# Patient Record
Sex: Male | Born: 1965 | Race: White | Hispanic: No | Marital: Married | Smoking: Never smoker
Health system: Southern US, Community
[De-identification: ages and names within clinical notes are randomized; demographics above are authoritative.]

## PROBLEM LIST (undated history)

## (undated) DIAGNOSIS — J309 Allergic rhinitis, unspecified: Secondary | ICD-10-CM

## (undated) DIAGNOSIS — K589 Irritable bowel syndrome without diarrhea: Secondary | ICD-10-CM

## (undated) DIAGNOSIS — K759 Inflammatory liver disease, unspecified: Secondary | ICD-10-CM

## (undated) HISTORY — DX: Irritable bowel syndrome without diarrhea: K58.9

## (undated) HISTORY — DX: Inflammatory liver disease, unspecified: K75.9

## (undated) HISTORY — DX: Allergic rhinitis, unspecified: J30.9

## (undated) HISTORY — PX: GANGLION CYST EXCISION: SHX1691

---

## 2014-12-08 ENCOUNTER — Inpatient Hospital Stay: Payer: 59

## 2014-12-08 ENCOUNTER — Encounter: Payer: Self-pay | Admitting: Hematology and Oncology

## 2014-12-08 ENCOUNTER — Inpatient Hospital Stay: Payer: 59 | Attending: Hematology and Oncology | Admitting: Hematology and Oncology

## 2014-12-08 ENCOUNTER — Other Ambulatory Visit: Payer: Self-pay | Admitting: *Deleted

## 2014-12-08 VITALS — BP 132/81 | HR 62 | Temp 96.4°F | Resp 18 | Ht 71.5 in | Wt 184.0 lb

## 2014-12-08 DIAGNOSIS — Z8619 Personal history of other infectious and parasitic diseases: Secondary | ICD-10-CM

## 2014-12-08 DIAGNOSIS — K589 Irritable bowel syndrome without diarrhea: Secondary | ICD-10-CM

## 2014-12-08 DIAGNOSIS — Z79899 Other long term (current) drug therapy: Secondary | ICD-10-CM | POA: Diagnosis not present

## 2014-12-08 DIAGNOSIS — D696 Thrombocytopenia, unspecified: Secondary | ICD-10-CM | POA: Diagnosis not present

## 2014-12-08 LAB — COMPREHENSIVE METABOLIC PANEL
ALT: 21 U/L (ref 17–63)
AST: 23 U/L (ref 15–41)
Albumin: 4.6 g/dL (ref 3.5–5.0)
Alkaline Phosphatase: 47 U/L (ref 38–126)
Anion gap: 11 (ref 5–15)
BUN: 13 mg/dL (ref 6–20)
CO2: 28 mmol/L (ref 22–32)
Calcium: 9.5 mg/dL (ref 8.9–10.3)
Chloride: 100 mmol/L — ABNORMAL LOW (ref 101–111)
Creatinine, Ser: 0.75 mg/dL (ref 0.61–1.24)
GFR calc Af Amer: >60 mL/min (ref >60)
GFR calc non Af Amer: >60 mL/min (ref >60)
Glucose, Bld: 108 mg/dL — ABNORMAL HIGH (ref 65–99)
Potassium: 3.9 mmol/L (ref 3.5–5.1)
Sodium: 139 mmol/L (ref 135–145)
Total Bilirubin: 0.9 mg/dL (ref 0.3–1.2)
Total Protein: 7.2 g/dL (ref 6.5–8.1)

## 2014-12-08 LAB — CBC WITH DIFFERENTIAL/PLATELET
Basophils Absolute: 0 10*3/uL (ref 0–0.1)
Basophils Relative: 1 %
Eosinophils Absolute: 0.1 10*3/uL (ref 0–0.7)
Eosinophils Relative: 2 %
HCT: 43.1 % (ref 40.0–52.0)
Hemoglobin: 14.7 g/dL (ref 13.0–18.0)
Lymphocytes Relative: 29 %
Lymphs Abs: 1.6 10*3/uL (ref 1.0–3.6)
MCH: 31.1 pg (ref 26.0–34.0)
MCHC: 34.2 g/dL (ref 32.0–36.0)
MCV: 91 fL (ref 80.0–100.0)
Monocytes Absolute: 0.4 10*3/uL (ref 0.2–1.0)
Monocytes Relative: 7 %
Neutro Abs: 3.3 10*3/uL (ref 1.4–6.5)
Neutrophils Relative %: 61 %
Platelets: 122 10*3/uL — ABNORMAL LOW (ref 150–440)
RBC: 4.74 MIL/uL (ref 4.40–5.90)
RDW: 12.3 % (ref 11.5–14.5)
WBC: 5.5 10*3/uL (ref 3.8–10.6)

## 2014-12-08 LAB — APTT: aPTT: 30 seconds (ref 24–36)

## 2014-12-08 LAB — PROTIME-INR
INR: 0.95
Prothrombin Time: 12.7 seconds (ref 11.4–15.0)

## 2014-12-08 LAB — FOLATE: Folate: 16.8 ng/mL (ref >5.9)

## 2014-12-08 NOTE — Progress Notes (Signed)
No changes since seeing Dr. Kandice Robinsons PCP in [place].

## 2014-12-08 NOTE — Progress Notes (Signed)
[facility] day:  12/08/2014  Chief Complaint: Edson Deridder is a 49 y.o. male  with thrombocytopenia who is referred in consultation by Dr. Kandice Robinsons.  HPI:  Patient states that he has had a platelet issue since 12/2013 when labs were drawn for a regular checkup and physical.  Labs on 12/18/2013 included a hematocrit of 44.2, hemoglobin 15.8, MCV 88.4, platelets 134,000, white count 6400 with an [place] of 3740.  Follow-up labs on 10/21/2014 revealed a platelet count of  121,000.  He denies any history of bruising or bleeding.  He denies any new medications except Bentyl which was prescribed after his thrombocytopenia was noted.  He states that he recently began taking herbal peppermint oil, ginger and fennel for irritable bowel.  He states that his diet is "everything in moderation".  Symptomatically, he feels great. He denies any issues. He has a history of hepatitis while in [place] in the 1970's.  He denies any prior transfusions. A few years ago, he was unable to donate blood secondary to hepatitis.   He denies any family history of any blood disorders or connective tissue disease.  Past Medical History  Diagnosis Date  . IBS (irritable bowel syndrome)   . Allergic rhinitis   . Hepatitis     Past Surgical History  Procedure Laterality Date  . Ganglion cyst excision      Family History  Problem Relation Age of Onset  . Cancer Maternal Grandmother     Pancreatic  . Cancer Maternal Grandfather     Skin Cancer    Social History:  reports that he has never smoked. He has never used smokeless tobacco. He reports that he drinks about 7.8 oz of alcohol per week. He reports that he uses illicit drugs (Marijuana).  The patient is alone today.  Allergies: Not on File  Current Medications: Current Outpatient Prescriptions  Medication Sig Dispense Refill  . dicyclomine (BENTYL) 10 MG capsule Take by mouth.    . naproxen sodium (RA NAPROXEN SODIUM) 220  MG tablet Take by mouth.    . fluticasone (FLONASE) 50 MCG/ACT nasal spray Place into the nose.     No current facility-administered medications for this visit.    Review of Systems:  GENERAL:  Feels great.  Active.  No fevers, sweats or weight loss. PERFORMANCE STATUS (ECOG):  0 HEENT:  No visual changes, runny nose, sore throat, mouth sores or tenderness. Lungs: No shortness of breath or cough.  No hemoptysis. Cardiac:  No chest pain, palpitations, orthopnea, or PND. GI:  Irritable bowel symptoms.  Constipation.  Diarrhea.  No nausea, vomiting, melena or hematochezia. GU:  No urgency, frequency, dysuria, or hematuria. Musculoskeletal:  No back pain.  No joint pain.  No muscle tenderness. Extremities:  No pain or swelling. Skin:  No rashes or skin changes. Neuro:  No headache, numbness or weakness, balance or coordination issues. Endocrine:  No diabetes, thyroid issues, hot flashes or night sweats. Psych:  No mood changes, depression or anxiety. Pain:  No focal pain. Review of systems:  All other systems reviewed and found to be negative.  Physical Exam: Blood pressure 132/81, pulse 62, temperature 96.4 F (35.8 C), temperature source Tympanic, resp. rate 18, height 5' 11.5" (1.816 m), weight 183 lb 15.6 oz (83.45 kg). GENERAL:  Well developed, well nourished, sitting comfortably in the exam room in no acute distress. MENTAL STATUS:  Alert and oriented to person, place and time. HEAD:  Short graying hair.  Goatee.  Normocephalic, atraumatic, face symmetric, no Cushingoid features. EYES:  Blue eyes.  Pupils equal round and reactive to light and accomodation.  No conjunctivitis or scleral icterus. ENT:  Oropharynx clear without lesion.  Tongue normal. Mucous membranes moist.  RESPIRATORY:  Clear to auscultation without rales, wheezes or rhonchi. CARDIOVASCULAR:  Regular rate and rhythm without murmur, rub or gallop. ABDOMEN:  Soft, non-tender, with active bowel sounds, and no  hepatosplenomegaly.  No masses. SKIN:  No rashes, ulcers or lesions. EXTREMITIES: No edema, no skin discoloration or tenderness.  No palpable cords. LYMPH NODES: No palpable cervical, supraclavicular, axillary or inguinal adenopathy  NEUROLOGICAL: Unremarkable. PSYCH:  Appropriate.  Orders Only on 12/08/2014  Component Date Value Ref Range Status  . Sodium 12/08/2014 139  135 - 145 mmol/L Final  . Potassium 12/08/2014 3.9  3.5 - 5.1 mmol/L Final  . Chloride 12/08/2014 100* 101 - 111 mmol/L Final  . CO2 12/08/2014 28  22 - 32 mmol/L Final  . Glucose, Bld 12/08/2014 108* 65 - 99 mg/dL Final  . BUN 12/08/2014 13  6 - 20 mg/dL Final  . Creatinine, Ser 12/08/2014 0.75  0.61 - 1.24 mg/dL Final  . Calcium 12/08/2014 9.5  8.9 - 10.3 mg/dL Final  . Total Protein 12/08/2014 7.2  6.5 - 8.1 g/dL Final  . Albumin 12/08/2014 4.6  3.5 - 5.0 g/dL Final  . AST 12/08/2014 23  15 - 41 U/L Final  . ALT 12/08/2014 21  17 - 63 U/L Final  . Alkaline Phosphatase 12/08/2014 47  38 - 126 U/L Final  . Total Bilirubin 12/08/2014 0.9  0.3 - 1.2 mg/dL Final  . GFR calc non Af Amer 12/08/2014 >60  >60 mL/min Final  . GFR calc Af Amer 12/08/2014 >60  >60 mL/min Final   Comment: (NOTE) The eGFR has been calculated using the CKD EPI equation. This calculation has not been validated in all clinical situations. eGFR's persistently <60 mL/min signify possible Chronic Kidney Disease.   . Anion gap 12/08/2014 11  5 - 15 Final  . Prothrombin Time 12/08/2014 12.7  11.4 - 15.0 seconds Final  . INR 12/08/2014 0.95   Final  . WBC 12/08/2014 5.5  3.8 - 10.6 K/uL Final  . RBC 12/08/2014 4.74  4.40 - 5.90 MIL/uL Final  . Hemoglobin 12/08/2014 14.7  13.0 - 18.0 g/dL Final  . HCT 12/08/2014 43.1  40.0 - 52.0 % Final  . MCV 12/08/2014 91.0  80.0 - 100.0 fL Final  . MCH 12/08/2014 31.1  26.0 - 34.0 pg Final  . MCHC 12/08/2014 34.2  32.0 - 36.0 g/dL Final  . RDW 12/08/2014 12.3  11.5 - 14.5 % Final  . Platelets 12/08/2014  122* 150 - 440 K/uL Final   CITRATE PLATELET COUNT 113  . Neutrophils Relative % 12/08/2014 61   Final  . Neutro Abs 12/08/2014 3.3  1.4 - 6.5 K/uL Final  . Lymphocytes Relative 12/08/2014 29   Final  . Lymphs Abs 12/08/2014 1.6  1.0 - 3.6 K/uL Final  . Monocytes Relative 12/08/2014 7   Final  . Monocytes Absolute 12/08/2014 0.4  0.2 - 1.0 K/uL Final  . Eosinophils Relative 12/08/2014 2   Final  . Eosinophils Absolute 12/08/2014 0.1  0 - 0.7 K/uL Final  . Basophils Relative 12/08/2014 1   Final  . Basophils Absolute 12/08/2014 0.0  0 - 0.1 K/uL Final    Assessment:  Keahi Mccarney is a 49 y.o. male with mild thrombocytopenia since 12/2013.  He denies any new medications or herbal products preceding documentation of thrombocytopenia.  He has a history of hepatitis in the early 1970's.  Diet is good.  He has no family history of any blood or connective tissue disorder.  Symptomatically, he denies any complaint.  Exam reveals no hepatosplenomegaly.  Plan: 1. Discuss differential diagnosis of thrombocytopenia including pseudo-thrombocytopenia, immune mediated thrombocytopenic purpura (ITP), hepatitis, HIV disease, vitamin defiency, monoclonal gammopathy, malignancy.  Patient consented for HIV testing..  2. Labs today:  CBC with diff (blue top tube),  CMP, PT, PTT, ANA, B12, folate, hepatitis B (surface antigen and surface antibody total), hepatitis C, HIV antibody (patient consented), SPEP, free light chains. 3. RTC in 1 week to discuss results.   Lequita Asal, MD  12/08/2014, 3:50 PM

## 2014-12-09 LAB — ANA W/REFLEX: Anti Nuclear Antibody(ANA): NEGATIVE

## 2014-12-09 LAB — PROTEIN ELECTROPHORESIS, SERUM
A/G Ratio: 1.7 (ref 0.7–1.7)
Albumin ELP: 4.2 g/dL (ref 2.9–4.4)
Alpha-1-Globulin: 0.2 g/dL (ref 0.0–0.4)
Alpha-2-Globulin: 0.6 g/dL (ref 0.4–1.0)
Beta Globulin: 1 g/dL (ref 0.7–1.3)
Gamma Globulin: 0.6 g/dL (ref 0.4–1.8)
Globulin, Total: 2.5 g/dL (ref 2.2–3.9)
Total Protein ELP: 6.7 g/dL (ref 6.0–8.5)

## 2014-12-09 LAB — VITAMIN B12: Vitamin B-12: 366 pg/mL (ref 180–914)

## 2014-12-09 LAB — HEPATITIS C ANTIBODY: HCV Ab: <0.1 s/co ratio (ref 0.0–0.9)

## 2014-12-09 LAB — KAPPA/LAMBDA LIGHT CHAINS
Kappa free light chain: 11.85 mg/L (ref 3.30–19.40)
Kappa, lambda light chain ratio: 1 (ref 0.26–1.65)
Lambda free light chains: 11.85 mg/L (ref 5.71–26.30)

## 2014-12-09 LAB — HEPATITIS B SURFACE ANTIGEN: Hepatitis B Surface Ag: NEGATIVE

## 2014-12-09 LAB — HIV ANTIBODY (ROUTINE TESTING W REFLEX): HIV Screen 4th Generation wRfx: NONREACTIVE

## 2014-12-09 LAB — HEPATITIS B CORE ANTIBODY, TOTAL: Hep B Core Total Ab: POSITIVE — AB

## 2014-12-15 ENCOUNTER — Ambulatory Visit: Payer: PRIVATE HEALTH INSURANCE | Admitting: Hematology and Oncology

## 2014-12-16 ENCOUNTER — Inpatient Hospital Stay: Payer: 59

## 2014-12-16 ENCOUNTER — Inpatient Hospital Stay: Payer: 59 | Attending: Hematology and Oncology | Admitting: Hematology and Oncology

## 2014-12-16 ENCOUNTER — Encounter: Payer: Self-pay | Admitting: Hematology and Oncology

## 2014-12-16 VITALS — BP 146/91 | HR 109 | Temp 96.4°F | Resp 18 | Ht 71.5 in | Wt 186.3 lb

## 2014-12-16 DIAGNOSIS — K589 Irritable bowel syndrome without diarrhea: Secondary | ICD-10-CM | POA: Insufficient documentation

## 2014-12-16 DIAGNOSIS — D696 Thrombocytopenia, unspecified: Secondary | ICD-10-CM | POA: Insufficient documentation

## 2014-12-16 DIAGNOSIS — Z79899 Other long term (current) drug therapy: Secondary | ICD-10-CM | POA: Diagnosis not present

## 2014-12-16 DIAGNOSIS — B1910 Unspecified viral hepatitis B without hepatic coma: Secondary | ICD-10-CM

## 2014-12-16 NOTE — Progress Notes (Signed)
NO changes since last visit.

## 2014-12-16 NOTE — Progress Notes (Signed)
[facility] day:  12/16/2014  Chief Complaint: Derek Torres is a 49 y.o. male  with thrombocytopenia who is seen for review of work-up and discussion regarding direction of therapy.  HPI:  The patient was last seen in the medical oncology clinic on 12/08/2014.  At that time he was seen for initial consultation regarding thrombocytopenia.  Platelet count was mildly low (121,000).  He denied any history of bruising or bleeding.  He denied any new medications.  He was taking herbal peppermint oil, ginger and fennel for irritable bowel.  Symptomatically, he felt great.  He described a history of hepatitis while in [place] in the 1970's.  He denied any prior transfusions.  A few years prior, he was unable to donate blood secondary to hepatitis.   He denied any family history of any blood disorders or connective tissue disease.  He underwent a work-up.  CBC with diff revealed a hematocrit of 43.1, hemoglobin 14.7, platelets 122,000, WBC 5500 with an ANC of 3300 (performed in a citrate tube to r/o pseudothrombocytopenia).  CMP was normal. PT 12.7, PTT 30, ANA negative, B12 366, folate 16.8, hepatitis C negative, HIV antibody negative, SPEP negative, and free light chains normal.  Hepatitis B core antibody was positive.  Hepatitis B surface antigen was negative.  Symptomatically, he denies any new complaints.  Past Medical History  Diagnosis Date  . IBS (irritable bowel syndrome)   . Allergic rhinitis   . Hepatitis     Past Surgical History  Procedure Laterality Date  . Ganglion cyst excision      Family History  Problem Relation Age of Onset  . Cancer Maternal Grandmother     Pancreatic  . Cancer Maternal Grandfather     Skin Cancer    Social History:  reports that he has never smoked. He has never used smokeless tobacco. He reports that he drinks about 7.8 oz of alcohol per week. He reports that he uses illicit drugs (Marijuana).  The patient is  alone today.  Allergies: No Known Allergies  Current Medications: Current Outpatient Prescriptions  Medication Sig Dispense Refill  . dicyclomine (BENTYL) 10 MG capsule Take by mouth.    . fluticasone (FLONASE) 50 MCG/ACT nasal spray Place into the nose.    . naproxen sodium (RA NAPROXEN SODIUM) 220 MG tablet Take by mouth.     No current facility-administered medications for this visit.    Review of Systems:  GENERAL:  Feels great.  Active.  No fevers, sweats or weight loss. PERFORMANCE STATUS (ECOG):  0 HEENT:  No visual changes, runny nose, sore throat, mouth sores or tenderness. Lungs: No shortness of breath or cough.  No hemoptysis. Cardiac:  No chest pain, palpitations, orthopnea, or PND. GI:  Irritable bowel symptoms.  Constipation.  Diarrhea.  No nausea, vomiting, melena or hematochezia. GU:  No urgency, frequency, dysuria, or hematuria. Musculoskeletal:  No back pain.  No joint pain.  No muscle tenderness. Extremities:  No pain or swelling. Skin:  No rashes or skin changes. Neuro:  No headache, numbness or weakness, balance or coordination issues. Endocrine:  No diabetes, thyroid issues, hot flashes or night sweats. Psych:  No mood changes, depression or anxiety. Pain:  No focal pain. Review of systems:  All other systems reviewed and found to be negative.  Physical Exam: Blood pressure 146/91, pulse 109, temperature 96.4 F (35.8 C), temperature source Tympanic, resp. rate 18, height 5' 11.5" (1.816 m), weight 186 lb 4.6  oz (84.5 kg). GENERAL:  Well developed, well nourished, sitting comfortably in the exam room in no acute distress. MENTAL STATUS:  Alert and oriented to person, place and time. HEAD:  Short graying hair.  Goatee.  Normocephalic, atraumatic, face symmetric, no Cushingoid features. EYES:  Blue eyes.  No conjunctivitis or scleral icterus. NEUROLOGICAL: Unremarkable. PSYCH:  Appropriate.  No visits with results within 3 Day(s) from this visit. Latest  known visit with results is:  Orders Only on 12/08/2014  Component Date Value Ref Range Status  . Sodium 12/08/2014 139  135 - 145 mmol/L Final  . Potassium 12/08/2014 3.9  3.5 - 5.1 mmol/L Final  . Chloride 12/08/2014 100* 101 - 111 mmol/L Final  . CO2 12/08/2014 28  22 - 32 mmol/L Final  . Glucose, Bld 12/08/2014 108* 65 - 99 mg/dL Final  . BUN 12/08/2014 13  6 - 20 mg/dL Final  . Creatinine, Ser 12/08/2014 0.75  0.61 - 1.24 mg/dL Final  . Calcium 12/08/2014 9.5  8.9 - 10.3 mg/dL Final  . Total Protein 12/08/2014 7.2  6.5 - 8.1 g/dL Final  . Albumin 12/08/2014 4.6  3.5 - 5.0 g/dL Final  . AST 12/08/2014 23  15 - 41 U/L Final  . ALT 12/08/2014 21  17 - 63 U/L Final  . Alkaline Phosphatase 12/08/2014 47  38 - 126 U/L Final  . Total Bilirubin 12/08/2014 0.9  0.3 - 1.2 mg/dL Final  . GFR calc non Af Amer 12/08/2014 >60  >60 mL/min Final  . GFR calc Af Amer 12/08/2014 >60  >60 mL/min Final   Comment: (NOTE) The eGFR has been calculated using the CKD EPI equation. This calculation has not been validated in all clinical situations. eGFR's persistently <60 mL/min signify possible Chronic Kidney Disease.   . Anion gap 12/08/2014 11  5 - 15 Final  . aPTT 12/08/2014 30  24 - 36 seconds Final  . Prothrombin Time 12/08/2014 12.7  11.4 - 15.0 seconds Final  . INR 12/08/2014 0.95   Final  . Anit Nuclear Antibody(ANA) 12/08/2014 Negative  Negative Final   Comment: (NOTE) Performed At: [facility] [place], [place] [id number] Lindon Romp MD AL:[id number]   . Total Protein ELP 12/08/2014 6.7  6.0 - 8.5 g/dL Final  . Albumin ELP 12/08/2014 4.2  2.9 - 4.4 g/dL Final  . Alpha-1-Globulin 12/08/2014 0.2  0.0 - 0.4 g/dL Final  . Alpha-2-Globulin 12/08/2014 0.6  0.4 - 1.0 g/dL Final  . Beta Globulin 12/08/2014 1.0  0.7 - 1.3 g/dL Final  . Gamma Globulin 12/08/2014 0.6  0.4 - 1.8 g/dL Final  . M-Spike, % 12/08/2014 Not Observed  Not Observed g/dL Final  . SPE Interp.  12/08/2014 Comment   Final   Comment: (NOTE) The SPE pattern appears essentially unremarkable. Evidence of monoclonal protein is not apparent. Performed At: [facility] [place], [place] [id number] Lindon Romp MD AS:[id number]   . Comment 12/08/2014 Comment   Final   Comment: (NOTE) Protein electrophoresis scan will follow via computer, mail, or courier delivery.   Marland Kitchen GLOBULIN, TOTAL 12/08/2014 2.5  2.2 - 3.9 g/dL Corrected  . A/G Ratio 12/08/2014 1.7  0.7 - 1.7 Corrected  . Kappa free light chain 12/08/2014 11.85  3.30 - 19.40 mg/L Final  . [place]da free light chains 12/08/2014 11.85  5.71 - 26.30 mg/L Final  . Kappa, lamda light chain ratio 12/08/2014 1.00  0.26 - 1.65 Final   Comment: (NOTE) Performed  At: [facility] [place], [place] [id number] Lindon Romp MD DD:[id number]   . Hep B Core Total Ab 12/08/2014 Positive* Negative Final   Comment: (NOTE) Performed At: [facility] [place], [place] [id number] Lindon Romp MD VV:[id number]   . Hepatitis B Surface Ag 12/08/2014 Negative  Negative Final   Comment: (NOTE) Performed At: [facility] [place], [place] [id number] Lindon Romp MD VO:[id number]   . HCV Ab 12/08/2014 <0.1  0.0 - 0.9 s/co ratio Final   Comment: (NOTE)                                  Negative:     < 0.8                             Indeterminate: 0.8 - 0.9                                  Positive:     > 0.9 The CDC recommends that a positive HCV antibody result be followed up with a HCV Nucleic Acid Amplification test (993716). Performed At: [facility] [place], [place] [id number] Lindon Romp MD FB:[id number]   . HIV Screen 4th Generation wRfx 12/08/2014 Non Reactive  Non Reactive Final   Comment: (NOTE) Performed At: [facility] [place], [place] [id number] Lindon Romp MD  WE:[id number]   . Vitamin B-12 12/08/2014 366  180 - 914 pg/mL Final   Comment: (NOTE) This assay is not validated for testing neonatal or myeloproliferative syndrome specimens for Vitamin B12 levels. Performed at [facility]   . Folate 12/08/2014 16.8  >5.9 ng/mL Final  . WBC 12/08/2014 5.5  3.8 - 10.6 K/uL Final  . RBC 12/08/2014 4.74  4.40 - 5.90 MIL/uL Final  . Hemoglobin 12/08/2014 14.7  13.0 - 18.0 g/dL Final  . HCT 12/08/2014 43.1  40.0 - 52.0 % Final  . MCV 12/08/2014 91.0  80.0 - 100.0 fL Final  . MCH 12/08/2014 31.1  26.0 - 34.0 pg Final  . MCHC 12/08/2014 34.2  32.0 - 36.0 g/dL Final  . RDW 12/08/2014 12.3  11.5 - 14.5 % Final  . Platelets 12/08/2014 122* 150 - 440 K/uL Final   CITRATE PLATELET COUNT 113  . Neutrophils Relative % 12/08/2014 61   Final  . Neutro Abs 12/08/2014 3.3  1.4 - 6.5 K/uL Final  . Lymphocytes Relative 12/08/2014 29   Final  . Lymphs Abs 12/08/2014 1.6  1.0 - 3.6 K/uL Final  . Monocytes Relative 12/08/2014 7   Final  . Monocytes Absolute 12/08/2014 0.4  0.2 - 1.0 K/uL Final  . Eosinophils Relative 12/08/2014 2   Final  . Eosinophils Absolute 12/08/2014 0.1  0 - 0.7 K/uL Final  . Basophils Relative 12/08/2014 1   Final  . Basophils Absolute 12/08/2014 0.0  0 - 0.1 K/uL Final    Assessment:  Zaylon Bossier is a 49 y.o. male with mild thrombocytopenia since 12/2013.  He is hepatitis B positive.  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  He denies any new medications or herbal products preceding documentation of thrombocytopenia.  Diet is good.  He has no family history of any blood or connective  tissue disorder.  Work-up on 12/08/2014 confirmed hepatitis B.  Negative/normal studies included PT, PTT, ANA, B12, folate, hepatitis C, HIV antibody, SPEP, and free light chains.    Symptomatically, he denies any complaint.  Exam reveals no hepatosplenomegaly.  Plan: 1. Discuss work-up.  Only positive test hepatitis B core  antibody consistent with prior infection.  Discuss also possible mild ITP.  Discuss mode of transmission for hepatitis B.  Discuss testing for his wife. 2. Labs today:  Hepatitis B DNA quantitative. 3. RTC prn.   Lequita Asal, MD  12/16/2014, 9:02 AM

## 2014-12-20 LAB — HEPATITIS B DNA, ULTRAQUANTITATIVE, PCR: HBV DNA SERPL PCR-LOG IU: UNDETERMINED log10IU/mL

## 2018-12-04 ENCOUNTER — Other Ambulatory Visit: Payer: Self-pay | Admitting: Gerontology

## 2018-12-04 DIAGNOSIS — Z0000 Encounter for general adult medical examination without abnormal findings: Secondary | ICD-10-CM

## 2018-12-04 DIAGNOSIS — Z800 Family history of malignant neoplasm of digestive organs: Secondary | ICD-10-CM

## 2018-12-04 DIAGNOSIS — B1910 Unspecified viral hepatitis B without hepatic coma: Secondary | ICD-10-CM

## 2018-12-04 DIAGNOSIS — Z7189 Other specified counseling: Secondary | ICD-10-CM

## 2018-12-12 ENCOUNTER — Ambulatory Visit
Admission: RE | Admit: 2018-12-12 | Discharge: 2018-12-12 | Disposition: A | Discharge status: Home / Self Care | Payer: 59 | Source: Ambulatory Visit | Attending: Gerontology | Admitting: Gerontology

## 2018-12-12 ENCOUNTER — Other Ambulatory Visit: Payer: Self-pay

## 2018-12-12 ENCOUNTER — Encounter (INDEPENDENT_AMBULATORY_CARE_PROVIDER_SITE_OTHER): Payer: Self-pay

## 2018-12-12 DIAGNOSIS — B1910 Unspecified viral hepatitis B without hepatic coma: Secondary | ICD-10-CM | POA: Insufficient documentation

## 2018-12-12 DIAGNOSIS — Z800 Family history of malignant neoplasm of digestive organs: Secondary | ICD-10-CM | POA: Insufficient documentation

## 2018-12-12 DIAGNOSIS — Z7189 Other specified counseling: Secondary | ICD-10-CM | POA: Insufficient documentation

## 2018-12-12 DIAGNOSIS — Z0000 Encounter for general adult medical examination without abnormal findings: Secondary | ICD-10-CM | POA: Diagnosis present

## 2019-01-09 ENCOUNTER — Ambulatory Visit: Payer: 59 | Admitting: Urology

## 2019-07-19 ENCOUNTER — Emergency Department: Payer: 59 | Admitting: Anesthesiology

## 2019-07-19 ENCOUNTER — Ambulatory Visit (INDEPENDENT_AMBULATORY_CARE_PROVIDER_SITE_OTHER): Payer: 59

## 2019-07-19 ENCOUNTER — Ambulatory Visit
Admission: EM | Admit: 2019-07-19 | Discharge: 2019-07-19 | Disposition: A | Discharge status: Home / Self Care | Payer: 59 | Source: Home / Self Care | Attending: Family Medicine | Admitting: Family Medicine

## 2019-07-19 ENCOUNTER — Other Ambulatory Visit: Payer: Self-pay

## 2019-07-19 ENCOUNTER — Observation Stay
Admission: EM | Admit: 2019-07-19 | Discharge: 2019-07-20 | Disposition: A | Discharge status: Home / Self Care | Payer: 59 | Attending: General Surgery | Admitting: General Surgery

## 2019-07-19 ENCOUNTER — Encounter: Payer: Self-pay | Admitting: Emergency Medicine

## 2019-07-19 ENCOUNTER — Encounter
Admission: EM | Disposition: A | Discharge status: Home / Self Care | Payer: Self-pay | Source: Home / Self Care | Attending: Emergency Medicine

## 2019-07-19 DIAGNOSIS — Z20822 Contact with and (suspected) exposure to covid-19: Secondary | ICD-10-CM | POA: Insufficient documentation

## 2019-07-19 DIAGNOSIS — K3580 Unspecified acute appendicitis: Secondary | ICD-10-CM

## 2019-07-19 DIAGNOSIS — K37 Unspecified appendicitis: Secondary | ICD-10-CM | POA: Diagnosis present

## 2019-07-19 DIAGNOSIS — K589 Irritable bowel syndrome without diarrhea: Secondary | ICD-10-CM | POA: Insufficient documentation

## 2019-07-19 DIAGNOSIS — Z79899 Other long term (current) drug therapy: Secondary | ICD-10-CM | POA: Insufficient documentation

## 2019-07-19 DIAGNOSIS — K759 Inflammatory liver disease, unspecified: Secondary | ICD-10-CM | POA: Diagnosis not present

## 2019-07-19 HISTORY — PX: LAPAROSCOPIC APPENDECTOMY: SHX408

## 2019-07-19 LAB — COMPREHENSIVE METABOLIC PANEL
ALT: 19 U/L (ref 0–44)
AST: 22 U/L (ref 15–41)
Albumin: 4.7 g/dL (ref 3.5–5.0)
Alkaline Phosphatase: 58 U/L (ref 38–126)
Anion gap: 10 (ref 5–15)
BUN: 15 mg/dL (ref 6–20)
CO2: 25 mmol/L (ref 22–32)
Calcium: 9.1 mg/dL (ref 8.9–10.3)
Chloride: 102 mmol/L (ref 98–111)
Creatinine, Ser: 0.81 mg/dL (ref 0.61–1.24)
GFR calc Af Amer: >60 mL/min (ref >60)
GFR calc non Af Amer: >60 mL/min (ref >60)
Glucose, Bld: 154 mg/dL — ABNORMAL HIGH (ref 70–99)
Potassium: 3.9 mmol/L (ref 3.5–5.1)
Sodium: 137 mmol/L (ref 135–145)
Total Bilirubin: 0.8 mg/dL (ref 0.3–1.2)
Total Protein: 7.7 g/dL (ref 6.5–8.1)

## 2019-07-19 LAB — CBC WITH DIFFERENTIAL/PLATELET
Abs Immature Granulocytes: 0.07 10*3/uL (ref 0.00–0.07)
Basophils Absolute: 0 10*3/uL (ref 0.0–0.1)
Basophils Relative: 0 %
Eosinophils Absolute: 0 10*3/uL (ref 0.0–0.5)
Eosinophils Relative: 0 %
HCT: 43.5 % (ref 39.0–52.0)
Hemoglobin: 15.2 g/dL (ref 13.0–17.0)
Immature Granulocytes: 0 %
Lymphocytes Relative: 4 %
Lymphs Abs: 0.6 10*3/uL — ABNORMAL LOW (ref 0.7–4.0)
MCH: 30.7 pg (ref 26.0–34.0)
MCHC: 34.9 g/dL (ref 30.0–36.0)
MCV: 87.9 fL (ref 80.0–100.0)
Monocytes Absolute: 0.7 10*3/uL (ref 0.1–1.0)
Monocytes Relative: 4 %
Neutro Abs: 14.3 10*3/uL — ABNORMAL HIGH (ref 1.7–7.7)
Neutrophils Relative %: 92 %
Platelets: 157 10*3/uL (ref 150–400)
RBC: 4.95 MIL/uL (ref 4.22–5.81)
RDW: 11.7 % (ref 11.5–15.5)
WBC: 15.6 10*3/uL — ABNORMAL HIGH (ref 4.0–10.5)
nRBC: 0 % (ref 0.0–0.2)

## 2019-07-19 LAB — LIPASE, BLOOD: Lipase: 27 U/L (ref 11–51)

## 2019-07-19 LAB — RESPIRATORY PANEL BY RT PCR (FLU A&B, COVID)
Influenza A by PCR: NEGATIVE
Influenza B by PCR: NEGATIVE
SARS Coronavirus 2 by RT PCR: NEGATIVE

## 2019-07-19 SURGERY — APPENDECTOMY, LAPAROSCOPIC
Anesthesia: General | Site: Abdomen

## 2019-07-19 MED ORDER — MIDAZOLAM HCL 2 MG/2ML IJ SOLN
INTRAMUSCULAR | Status: DC | PRN
  Administered 2019-07-19: 2 mg via INTRAVENOUS

## 2019-07-19 MED ORDER — SUCCINYLCHOLINE CHLORIDE 20 MG/ML IJ SOLN
INTRAMUSCULAR | Status: DC | PRN
  Administered 2019-07-19: 100 mg via INTRAVENOUS

## 2019-07-19 MED ORDER — MORPHINE SULFATE (PF) 2 MG/ML IV SOLN: 2.0000 mg | INTRAVENOUS | Status: DC | PRN

## 2019-07-19 MED ORDER — HYDROCODONE-ACETAMINOPHEN 5-325 MG PO TABS
1.0000 | ORAL_TABLET | ORAL | Status: DC | PRN
  Administered 2019-07-20: 14:00:00 1 via ORAL
  Filled 2019-07-19: qty 1

## 2019-07-19 MED ORDER — ONDANSETRON 4 MG PO TBDP: 4.0000 mg | ORAL_TABLET | ORAL | Status: DC | PRN

## 2019-07-19 MED ORDER — ACETAMINOPHEN 10 MG/ML IV SOLN
INTRAVENOUS | Status: DC | PRN
  Administered 2019-07-19: 1000 mg via INTRAVENOUS

## 2019-07-19 MED ORDER — OXYCODONE HCL 5 MG PO TABS: 5.0000 mg | ORAL_TABLET | Freq: Once | ORAL | Status: DC | PRN

## 2019-07-19 MED ORDER — ONDANSETRON HCL 4 MG/2ML IJ SOLN
INTRAMUSCULAR | Status: DC | PRN
  Administered 2019-07-19: 4 mg via INTRAVENOUS

## 2019-07-19 MED ORDER — FLUTICASONE PROPIONATE 50 MCG/ACT NA SUSP
2.0000 | Freq: Every day | NASAL | Status: DC | PRN
  Filled 2019-07-19: qty 16

## 2019-07-19 MED ORDER — PROMETHAZINE HCL 25 MG/ML IJ SOLN: 12.5000 mg | Freq: Four times a day (QID) | INTRAMUSCULAR | Status: DC | PRN

## 2019-07-19 MED ORDER — MIDAZOLAM HCL 2 MG/2ML IJ SOLN
INTRAMUSCULAR | Status: AC
  Filled 2019-07-19: qty 2

## 2019-07-19 MED ORDER — SEVOFLURANE IN SOLN
RESPIRATORY_TRACT | Status: AC
  Filled 2019-07-19: qty 250

## 2019-07-19 MED ORDER — DEXAMETHASONE SODIUM PHOSPHATE 10 MG/ML IJ SOLN
INTRAMUSCULAR | Status: AC
  Filled 2019-07-19: qty 1

## 2019-07-19 MED ORDER — SODIUM CHLORIDE 0.9 % IV BOLUS
1000.0000 mL | Freq: Once | INTRAVENOUS | Status: AC
  Administered 2019-07-19: 15:00:00 1000 mL via INTRAVENOUS

## 2019-07-19 MED ORDER — TIZANIDINE HCL 2 MG PO TABS
2.0000 mg | ORAL_TABLET | Freq: Every evening | ORAL | Status: DC | PRN
  Filled 2019-07-19: qty 1

## 2019-07-19 MED ORDER — SUGAMMADEX SODIUM 200 MG/2ML IV SOLN
INTRAVENOUS | Status: DC | PRN
  Administered 2019-07-19: 200 mg via INTRAVENOUS

## 2019-07-19 MED ORDER — KETOROLAC TROMETHAMINE 30 MG/ML IJ SOLN
30.0000 mg | Freq: Once | INTRAMUSCULAR | Status: AC
  Administered 2019-07-19: 15:00:00 30 mg via INTRAVENOUS

## 2019-07-19 MED ORDER — LACTATED RINGERS IV SOLN: INTRAVENOUS | Status: DC | PRN

## 2019-07-19 MED ORDER — FENTANYL CITRATE (PF) 100 MCG/2ML IJ SOLN
INTRAMUSCULAR | Status: DC | PRN
  Administered 2019-07-19: 25 ug via INTRAVENOUS
  Administered 2019-07-19: 100 ug via INTRAVENOUS
  Administered 2019-07-19: 25 ug via INTRAVENOUS
  Administered 2019-07-19: 50 ug via INTRAVENOUS

## 2019-07-19 MED ORDER — LIDOCAINE HCL (CARDIAC) PF 100 MG/5ML IV SOSY
PREFILLED_SYRINGE | INTRAVENOUS | Status: DC | PRN
  Administered 2019-07-19: 100 mg via INTRAVENOUS

## 2019-07-19 MED ORDER — PROPOFOL 10 MG/ML IV BOLUS
INTRAVENOUS | Status: AC
  Filled 2019-07-19: qty 20

## 2019-07-19 MED ORDER — ONDANSETRON HCL 4 MG/2ML IJ SOLN
4.0000 mg | Freq: Once | INTRAMUSCULAR | Status: AC | PRN
  Administered 2019-07-19: 21:00:00 4 mg via INTRAVENOUS

## 2019-07-19 MED ORDER — OXYCODONE HCL 5 MG/5ML PO SOLN: 5.0000 mg | Freq: Once | ORAL | Status: DC | PRN

## 2019-07-19 MED ORDER — FENTANYL CITRATE (PF) 100 MCG/2ML IJ SOLN
INTRAMUSCULAR | Status: AC
  Filled 2019-07-19: qty 2

## 2019-07-19 MED ORDER — IBUPROFEN 400 MG PO TABS: 400.0000 mg | ORAL_TABLET | ORAL | Status: DC | PRN

## 2019-07-19 MED ORDER — ENOXAPARIN SODIUM 40 MG/0.4ML ~~LOC~~ SOLN
40.0000 mg | SUBCUTANEOUS | Status: DC
  Administered 2019-07-20: 08:00:00 40 mg via SUBCUTANEOUS
  Filled 2019-07-19: qty 0.4

## 2019-07-19 MED ORDER — IOHEXOL 300 MG/ML  SOLN
100.0000 mL | Freq: Once | INTRAMUSCULAR | Status: AC | PRN
  Administered 2019-07-19: 16:00:00 100 mL via INTRAVENOUS

## 2019-07-19 MED ORDER — FENTANYL CITRATE (PF) 100 MCG/2ML IJ SOLN
25.0000 ug | INTRAMUSCULAR | Status: DC | PRN
  Administered 2019-07-19: 50 ug via INTRAVENOUS

## 2019-07-19 MED ORDER — ACETAMINOPHEN 10 MG/ML IV SOLN
INTRAVENOUS | Status: AC
  Filled 2019-07-19: qty 100

## 2019-07-19 MED ORDER — ACETAMINOPHEN 10 MG/ML IV SOLN
1000.0000 mg | Freq: Four times a day (QID) | INTRAVENOUS | Status: DC
  Administered 2019-07-20 (×3): 1000 mg via INTRAVENOUS
  Filled 2019-07-19 (×6): qty 100

## 2019-07-19 MED ORDER — BUPIVACAINE HCL (PF) 0.5 % IJ SOLN
INTRAMUSCULAR | Status: DC | PRN
  Administered 2019-07-19: 30 mL

## 2019-07-19 MED ORDER — ONDANSETRON HCL 4 MG/2ML IJ SOLN
INTRAMUSCULAR | Status: AC
  Filled 2019-07-19: qty 2

## 2019-07-19 MED ORDER — ROCURONIUM BROMIDE 10 MG/ML (PF) SYRINGE
PREFILLED_SYRINGE | INTRAVENOUS | Status: AC
  Filled 2019-07-19: qty 10

## 2019-07-19 MED ORDER — SODIUM CHLORIDE 0.9 % IV SOLN
1.0000 g | INTRAVENOUS | Status: DC
  Filled 2019-07-19: qty 1

## 2019-07-19 MED ORDER — FENTANYL CITRATE (PF) 100 MCG/2ML IJ SOLN
INTRAMUSCULAR | Status: AC
  Administered 2019-07-19: 21:00:00 50 ug via INTRAVENOUS
  Filled 2019-07-19: qty 2

## 2019-07-19 MED ORDER — LIDOCAINE HCL (PF) 2 % IJ SOLN
INTRAMUSCULAR | Status: AC
  Filled 2019-07-19: qty 5

## 2019-07-19 MED ORDER — SODIUM CHLORIDE 0.9 % IV SOLN
1.0000 g | INTRAVENOUS | Status: AC
  Administered 2019-07-19: 1 g via INTRAVENOUS
  Filled 2019-07-19: qty 1

## 2019-07-19 MED ORDER — PROPOFOL 10 MG/ML IV BOLUS
INTRAVENOUS | Status: DC | PRN
  Administered 2019-07-19: 170 mg via INTRAVENOUS

## 2019-07-19 MED ORDER — ROCURONIUM BROMIDE 100 MG/10ML IV SOLN
INTRAVENOUS | Status: DC | PRN
  Administered 2019-07-19: 25 mg via INTRAVENOUS
  Administered 2019-07-19: 20 mg via INTRAVENOUS
  Administered 2019-07-19: 5 mg via INTRAVENOUS
  Administered 2019-07-19: 10 mg via INTRAVENOUS

## 2019-07-19 MED ORDER — DEXAMETHASONE SODIUM PHOSPHATE 10 MG/ML IJ SOLN
INTRAMUSCULAR | Status: DC | PRN
  Administered 2019-07-19: 10 mg via INTRAVENOUS

## 2019-07-19 SURGICAL SUPPLY — 40 items
BLADE SURG 11 STRL SS SAFETY (MISCELLANEOUS) ×3 IMPLANT
CANISTER SUCT 1200ML W/VALVE (MISCELLANEOUS) ×3 IMPLANT
CANNULA DILATOR  5MM W/SLV (CANNULA) ×1
CANNULA DILATOR 10 W/SLV (CANNULA) ×4 IMPLANT
CANNULA DILATOR 10MM W/SLV (CANNULA) ×2
CANNULA DILATOR 5 W/SLV (CANNULA) ×2 IMPLANT
CHLORAPREP W/TINT 26 (MISCELLANEOUS) ×3 IMPLANT
CLOSURE WOUND 1/2 X4 (GAUZE/BANDAGES/DRESSINGS) ×1
COVER WAND RF STERILE (DRAPES) ×3 IMPLANT
CUTTER FLEX LINEAR 45M (STAPLE) ×3 IMPLANT
DRSG TEGADERM 2-3/8X2-3/4 SM (GAUZE/BANDAGES/DRESSINGS) ×9 IMPLANT
DRSG TELFA 4X3 1S NADH ST (GAUZE/BANDAGES/DRESSINGS) ×3 IMPLANT
ELECT REM PT RETURN 9FT ADLT (ELECTROSURGICAL) ×3
ELECTRODE REM PT RTRN 9FT ADLT (ELECTROSURGICAL) ×1 IMPLANT
GLOVE BIO SURGEON STRL SZ7.5 (GLOVE) ×9 IMPLANT
GLOVE INDICATOR 8.0 STRL GRN (GLOVE) ×9 IMPLANT
GOWN STRL REUS W/ TWL LRG LVL3 (GOWN DISPOSABLE) ×2 IMPLANT
GOWN STRL REUS W/TWL LRG LVL3 (GOWN DISPOSABLE) ×4
GRASPER SUT TROCAR 14GX15 (MISCELLANEOUS) ×3 IMPLANT
IRRIGATION STRYKERFLOW (MISCELLANEOUS) ×1 IMPLANT
IRRIGATOR STRYKERFLOW (MISCELLANEOUS) ×3
IV LACTATED RINGERS 1000ML (IV SOLUTION) ×3 IMPLANT
KIT TURNOVER KIT A (KITS) ×3 IMPLANT
LABEL OR SOLS (LABEL) ×3 IMPLANT
NDL INSUFF ACCESS 14 VERSASTEP (NEEDLE) ×3 IMPLANT
NEEDLE HYPO 22GX1.5 SAFETY (NEEDLE) ×3 IMPLANT
NS IRRIG 1000ML POUR BTL (IV SOLUTION) IMPLANT
PACK LAP CHOLECYSTECTOMY (MISCELLANEOUS) ×3 IMPLANT
POUCH ENDO CATCH 10MM SPEC (MISCELLANEOUS) ×3 IMPLANT
RELOAD 45 VASCULAR/THIN (ENDOMECHANICALS) ×6 IMPLANT
RELOAD STAPLE TA45 3.5 REG BLU (ENDOMECHANICALS) ×6 IMPLANT
SET TUBE SMOKE EVAC HIGH FLOW (TUBING) ×3 IMPLANT
STRIP CLOSURE SKIN 1/2X4 (GAUZE/BANDAGES/DRESSINGS) ×2 IMPLANT
SUT PDS AB 1 CT1 36 (SUTURE) ×3 IMPLANT
SUT VIC AB 0 CT2 27 (SUTURE) ×3 IMPLANT
SUT VIC AB 4-0 FS2 27 (SUTURE) ×3 IMPLANT
SWABSTK COMLB BENZOIN TINCTURE (MISCELLANEOUS) ×3 IMPLANT
SYR 10ML LL (SYRINGE) IMPLANT
TRAY FOLEY MTR SLVR 16FR STAT (SET/KITS/TRAYS/PACK) IMPLANT
TROCAR XCEL 12X100 BLDLESS (ENDOMECHANICALS) IMPLANT

## 2019-07-19 NOTE — Transfer of Care (Signed)
Immediate Anesthesia Transfer of Care Note  Patient: [name]  Procedure(s) Performed: APPENDECTOMY LAPAROSCOPIC (N/A Abdomen)  Patient Location: PACU  Anesthesia Type:General  Level of Consciousness: drowsy  Airway & Oxygen Therapy: Patient Spontanous Breathing and Patient connected to face mask oxygen  Post-op Assessment: Report given to RN and Post -op Vital signs reviewed and stable  Post vital signs: Reviewed and stable  Last Vitals:  Vitals Value Taken Time  BP 140/86 07/19/19 2035  Temp 37 C 07/19/19 2035  Pulse 76 07/19/19 2035  Resp 16 07/19/19 2035  SpO2 100 % 07/19/19 2035    Last Pain:  Vitals:   07/19/19 1854  TempSrc:   PainSc: 2       Patients Stated Pain Goal: 1 ([id number])  Complications: No apparent anesthesia complications

## 2019-07-19 NOTE — Anesthesia Procedure Notes (Signed)
Procedure Name: Intubation Date/Time: 07/19/2019 7:05 PM Performed by: Johnna Acosta, CRNA Pre-anesthesia Checklist: Patient identified, Emergency Drugs available, Suction available, Patient being monitored and Timeout performed Patient Re-evaluated:Patient Re-evaluated prior to induction Oxygen Delivery Method: Circle system utilized Preoxygenation: Pre-oxygenation with 100% oxygen Induction Type: IV induction, Rapid sequence and Cricoid Pressure applied Laryngoscope Size: McGraph and 3 Grade View: Grade I Tube type: Oral Tube size: 7.5 mm Number of attempts: 1 Airway Equipment and Method: Stylet and Video-laryngoscopy Placement Confirmation: ETT inserted through vocal cords under direct vision,  positive ETCO2 and breath sounds checked- equal and bilateral Secured at: 21 cm Tube secured with: Tape Dental Injury: Teeth and Oropharynx as per pre-operative assessment

## 2019-07-19 NOTE — ED Triage Notes (Signed)
First RN Note: Pt presents to ED via POV with 20G IV to L AC from [place]. Pt with confirmed and known acute appendicitis from CT scan at [facility] earlier today. Pt received 1L fluid and 30mg  Toradol IV. Pt sent POV from [facility] to ED.

## 2019-07-19 NOTE — Anesthesia Preprocedure Evaluation (Signed)
Anesthesia Evaluation  Patient identified by MRN, date of birth, ID band Patient awake    Reviewed: Allergy & Precautions, H&P , NPO status , Patient's Chart, lab work & pertinent test results  History of Anesthesia Complications Negative for: history of anesthetic complications  Airway Mallampati: II  TM Distance: >3 FB Neck ROM: full    Dental  (+) Chipped, Teeth Intact   Pulmonary neg pulmonary ROS, neg COPD, Not current smoker,    Pulmonary exam normal breath sounds clear to auscultation       Cardiovascular (-) angina(-) Past MI negative cardio ROS Normal cardiovascular exam(-) dysrhythmias  Rhythm:regular Rate:Normal     Neuro/Psych negative neurological ROS  negative psych ROS   GI/Hepatic negative GI ROS, (+) Hepatitis -, B  Endo/Other  negative endocrine ROS  Renal/GU      Musculoskeletal   Abdominal   Peds  Hematology negative hematology ROS (+)   Anesthesia Other Findings Past Medical History: No date: Allergic rhinitis No date: Hepatitis No date: IBS (irritable bowel syndrome)  Past Surgical History: No date: GANGLION CYST EXCISION  BMI    Body Mass Index: 26.50 kg/m      Reproductive/Obstetrics negative OB ROS                             Anesthesia Physical Anesthesia Plan  ASA: I  Anesthesia Plan: General ETT   Post-op Pain Management:    Induction:   PONV Risk Score and Plan: Ondansetron, Dexamethasone, Midazolam and Treatment may vary due to age or medical condition  Airway Management Planned:   Additional Equipment:   Intra-op Plan:   Post-operative Plan:   Informed Consent: I have reviewed the patients History and Physical, chart, labs and discussed the procedure including the risks, benefits and alternatives for the proposed anesthesia with the patient or authorized representative who has indicated his/her understanding and acceptance.      Dental Advisory Given  Plan Discussed with: Anesthesiologist, CRNA and Surgeon  Anesthesia Plan Comments:         Anesthesia Quick Evaluation

## 2019-07-19 NOTE — Discharge Instructions (Signed)
Straight to hospital.  Take care  Dr. Lacinda Axon

## 2019-07-19 NOTE — Op Note (Signed)
Preoperative diagnosis: Acute appendicitis.  Postoperative diagnosis: Same.  Operative procedure: Laparoscopic appendectomy.  Operating surgeon: Hervey Ard, MD.  Anesthesia: General endotracheal, Marcaine 0.5%, plain, 30 cc.  Estimated blood loss: 30 cc.  Clinical note: This 54 year old male was well until 4:00 this morning when he had the sudden onset of abdominal pain that awakened him from sleep.  This progressed over the next several hours.  He subsequently was seen in the urgent care center this afternoon noted to have a elevated white blood cell count.  CT scan showed marked enlargement of the appendix consistent with acute appendicitis.  He was not felt to be a candidate for nonoperative treatment.  He is brought to the operating for planned appendectomy.  The patient had SCD stockings in place at the time of surgery.  Shortly after induction of anesthesia he did received Invanz 1 g intravenously.  Operative note: The patient underwent general endotracheal anesthesia and tolerated this well.  The hair was removed from the abdominal wall with clippers.  The abdomen was cleansed with ChloraPrep and draped.  In Trendelenburg position a varies needle was placed through a transumbilical incision.  After assuring intra-abdominal location insufflation was undertaken.  This was progressing somewhat slowly and it appeared that there was in the subfascial plane above the peritoneum.  The needle was repositioned with easy instillation of the CO2.  A 10 mm Step port was expanded and inspection showed no evidence of injury from initial port placement.  A 5 mm Port was placed in the hypogastrium.  The markedly swollen appendix that was draped over the pelvic sidewall was identified.  A 12 mm XL port was made outside the rectus sheath on the left side of the abdomen.  Local anesthesia was infiltrated before all incisions.  The appendix was separated from the adjacent structures with blunt  dissection.  Mild friability of the surface was noted.  No evidence of frank perforation.  There was about 5 cc of turbid fluid in the pelvis.  The antimesenteric sale of the terminal ileum was pushed out of the way to allow exposure of the base of the appendix.  The inflammatory process went up to within 5 mm of the junction of the appendix with the base of the cecum.  The plane was cleared with a right angle dissector and a blue cartridge placed.  This was fired with excellent hemostasis.  The the mesentery was cleared and divided with 2 applications of the white vascular stapler.  This provided excellent hemostasis.  The appendix was subsequently delivered to the umbilical port site as it was anticipated to require extension of the incision.  This was indeed necessary.  The appendix was placed in an Endo Catch bag and the fascial incision expanded to allow passage of the markedly dilated appendix.  The pelvis and right lower quadrant was irrigated with lactated Ringer solution.  Excellent hemostasis was noted.  The 12 mm lateral port site was closed with a single 0 Vicryl suture making use of the TMI suture passer.  The fascia at the umbilicus was closed after desufflation with a 2-0 PDS figure-of-eight suture.  The hypogastric port site did not require closure.  The skin incisions were closed with 4-0 Vicryl subcuticular sutures.  Benzoin, Steri-Strips, Telfa and Tegaderm dressings were applied.  The patient tolerated the procedure well and was taken to recovery in stable condition.

## 2019-07-19 NOTE — ED Triage Notes (Signed)
Patient complains of abdominal pain that started around 4am this morning. Patient states that pain is worse at belly button. Patient states that he feels like he has a brick in his abdomen. Reports that he has been extremely nausea but only vomited bile.

## 2019-07-19 NOTE — ED Provider Notes (Signed)
[facility] Emergency Department Provider Note  Time seen: 5:27 PM  I have reviewed the triage vital signs and the nursing notes.   HISTORY  Chief Complaint Appendicitis   HPI Derek Torres is a 54 y.o. male with a past medical history of IBS, hepatitis B, presents to the emergency department for right lower quadrant abdominal pain sent from urgent care with CT proven appendicitis.   Patient states woke up this morning early around 4:00 with right lower quadrant abdominal pain.  States moderate severity aching type pain.  Positive for nausea and vomiting this morning.  Negative for diarrhea.  No urinary symptoms.  Patient went to urgent care had a CT scan performed showing appendicitis.  Surgery is aware and was sent to the emergency department for evaluation and treatment.  Past Medical History:  Diagnosis Date  . Allergic rhinitis   . Hepatitis   . IBS (irritable bowel syndrome)     Patient Active Problem List   Diagnosis Date Noted  . Hepatitis B 12/16/2014  . Thrombocytopenia ([place]) 12/08/2014    Past Surgical History:  Procedure Laterality Date  . GANGLION CYST EXCISION      Prior to Admission medications   Medication Sig Start Date End Date Taking? Authorizing Provider  dicyclomine (BENTYL) 10 MG capsule Take by mouth. 10/21/14 10/21/15  [provider]  fluticasone (FLONASE) 50 MCG/ACT nasal spray Place into the nose.    [provider]  naproxen sodium (RA NAPROXEN SODIUM) 220 MG tablet Take by mouth.    [provider]    No Known Allergies  Family History  Problem Relation Age of Onset  . Cancer Maternal Grandmother        Pancreatic  . Cancer Maternal Grandfather        Skin Cancer    Social History Social History   Tobacco Use  . Smoking status: Never Smoker  . Smokeless tobacco: Never Used  Substance Use Topics  . Alcohol use: Yes    Alcohol/week: 13.0 standard drinks    Types: 1 Glasses of wine, 10  Cans of beer, 2 Shots of liquor per week    Comment: occasionally  . Drug use: Yes    Types: Marijuana    Comment: Every day,  Twice daily     Review of Systems Constitutional: Negative for fever. Cardiovascular: Negative for chest pain. Respiratory: Negative for shortness of breath. Gastrointestinal: Right lower quadrant abdominal pain.  Positive for nausea vomiting.  Negative diarrhea. Genitourinary: Negative for urinary compaints Musculoskeletal: Negative for musculoskeletal complaints Neurological: Negative for headache All other ROS negative  ____________________________________________   PHYSICAL EXAM:  VITAL SIGNS: ED Triage Vitals  Enc Vitals Group     BP 07/19/19 1700 137/87     Pulse Rate 07/19/19 1700 90     Resp 07/19/19 1700 16     Temp 07/19/19 1700 98.1 F (36.7 C)     Temp Source 07/19/19 1700 Oral     SpO2 07/19/19 1700 97 %     Weight 07/19/19 1659 190 lb (86.2 kg)     Height 07/19/19 1659 5\' 11"  (1.803 m)     Head Circumference --      Peak Flow --      Pain Score 07/19/19 1659 2     Pain Loc --      Pain Edu? --      Excl. in [place]? --    Constitutional: Alert and oriented. Well appearing and in no distress.  Eyes: Normal exam ENT      Head: Normocephalic and atraumatic.      Mouth/Throat: Mucous membranes are moist. Cardiovascular: Normal rate, regular rhythm Respiratory: Normal respiratory effort without tachypnea nor retractions. Breath sounds are clear  Gastrointestinal: Soft, mild right lower quadrant tenderness to palpation.  No rebound guarding or distention. Musculoskeletal: Nontender with normal range of motion in all extremities.  Neurologic:  Normal speech and language. No gross focal neurologic deficits Skin:  Skin is warm, dry and intact.  Psychiatric: Mood and affect are normal.   ____________________________________________   INITIAL IMPRESSION / ASSESSMENT AND PLAN / ED COURSE  Pertinent labs & imaging results that were  available during my care of the patient were reviewed by me and considered in my medical decision making (see chart for details).   Patient presents emergency department right lower quadrant abdominal pain had a CT scan showing acute appendicitis.  Dr. Tollie Pizza is aware of the patient.  He has seen the patient in the emergency department will be taking to the OR shortly.  We will perform a Covid swab.  Lab work is already been performed.  Derek Torres was evaluated in Emergency Department on 07/19/2019 for the symptoms described in the history of present illness. He was evaluated in the context of the global COVID-19 pandemic, which necessitated consideration that the patient might be at risk for infection with the SARS-CoV-2 virus that causes COVID-19. Institutional protocols and algorithms that pertain to the evaluation of patients at risk for COVID-19 are in a state of rapid change based on information released by regulatory bodies including the CDC and federal and state organizations. These policies and algorithms were followed during the patient's care in the ED.  ____________________________________________   FINAL CLINICAL IMPRESSION(S) / ED DIAGNOSES  Right lower quadrant abdominal pain.  Acute appendicitis   Harvest Dark, MD 07/19/19 1730

## 2019-07-19 NOTE — ED Notes (Addendum)
Pt resting, waiting dispo to OR.  No acute s/s of disterss. Will continue to monitor/reassess.

## 2019-07-19 NOTE — ED Provider Notes (Signed)
[facility]    CSN: [id number] Arrival date & time: 07/19/19  1405  History   Chief Complaint Chief Complaint  Patient presents with   Abdominal Pain   HPI  54 year old male presents with abdominal pain.  Started abruptly this morning around 4 AM.  He reports nausea and vomiting.  Patient reports pain around the umbilicus as well as in the right lower quadrant.  No fever.  Symptoms intractable.  Pain 8/10 in severity.  No relieving factors.  Also reports that he is unable to have a bowel movement.  No other associated symptoms.  No other complaints.  Past Medical History:  Diagnosis Date   Allergic rhinitis    Hepatitis    IBS (irritable bowel syndrome)     Patient Active Problem List   Diagnosis Date Noted   Hepatitis B 12/16/2014   Thrombocytopenia ([place]) 12/08/2014    Past Surgical History:  Procedure Laterality Date   GANGLION CYST EXCISION         Home Medications    Prior to Admission medications   Medication Sig Start Date End Date Taking? Authorizing Provider  dicyclomine (BENTYL) 10 MG capsule Take by mouth. 10/21/14 10/21/15  [provider]  fluticasone (FLONASE) 50 MCG/ACT nasal spray Place into the nose.    [provider]  naproxen sodium (RA NAPROXEN SODIUM) 220 MG tablet Take by mouth.    [provider]    Family History Family History  Problem Relation Age of Onset   Cancer Maternal Grandmother        Pancreatic   Cancer Maternal Grandfather        Skin Cancer    Social History Social History   Tobacco Use   Smoking status: Never Smoker   Smokeless tobacco: Never Used  Substance Use Topics   Alcohol use: Yes    Alcohol/week: 13.0 standard drinks    Types: 1 Glasses of wine, 10 Cans of beer, 2 Shots of liquor per week    Comment: occasionally   Drug use: Yes    Types: Marijuana    Comment: Every day,  Twice daily      Allergies   Patient has no known allergies.   Review of  Systems Review of Systems  Constitutional: Positive for appetite change. Negative for fever.  Gastrointestinal: Positive for abdominal pain, nausea and vomiting.   Physical Exam Triage Vital Signs ED Triage Vitals  Enc Vitals Group     BP 07/19/19 1430 (!) 141/83     Pulse Rate 07/19/19 1430 (!) 56     Resp 07/19/19 1430 18     Temp 07/19/19 1430 98.2 F (36.8 C)     Temp Source 07/19/19 1430 Oral     SpO2 07/19/19 1430 100 %     Weight 07/19/19 1427 190 lb (86.2 kg)     Height 07/19/19 1427 5\' 11"  (1.803 m)     Head Circumference --      Peak Flow --      Pain Score 07/19/19 1427 8     Pain Loc --      Pain Edu? --      Excl. in [place]? --    Updated Vital Signs BP (!) 141/83 (BP Location: Left Arm)    Pulse (!) 56    Temp 98.2 F (36.8 C) (Oral)    Resp 18    Ht 5\' 11"  (1.803 m)    Wt 86.2 kg    SpO2 100%  BMI 26.50 kg/m   Visual Acuity Right Eye Distance:   Left Eye Distance:   Bilateral Distance:    Right Eye Near:   Left Eye Near:    Bilateral Near:     Physical Exam Vitals and nursing note reviewed.  Constitutional:      Comments: Appears in distress secondary to pain.  HENT:     Head: Normocephalic and atraumatic.  Eyes:     General:        Right eye: No discharge.        Left eye: No discharge.     Conjunctiva/sclera: Conjunctivae normal.  Cardiovascular:     Rate and Rhythm: Regular rhythm. Bradycardia present.  Pulmonary:     Effort: Pulmonary effort is normal.     Breath sounds: Normal breath sounds. No wheezing, rhonchi or rales.  Abdominal:     General: There is no distension.     Palpations: Abdomen is soft.     Comments: Tenderness to palpation in the right lower quadrant.  Neurological:     Mental Status: He is alert.  Psychiatric:        Mood and Affect: Mood normal.        Behavior: Behavior normal.    UC Treatments / Results  Labs (all labs ordered are listed, but only abnormal results are displayed) Labs Reviewed  COMPREHENSIVE  METABOLIC PANEL - Abnormal; Notable for the following components:      Result Value   Glucose, Bld 154 (*)    All other components within normal limits  CBC WITH DIFFERENTIAL/PLATELET - Abnormal; Notable for the following components:   WBC 15.6 (*)    Neutro Abs 14.3 (*)    Lymphs Abs 0.6 (*)    All other components within normal limits  LIPASE, BLOOD    EKG   Radiology CT ABDOMEN PELVIS W CONTRAST  Result Date: 07/19/2019 CLINICAL DATA:  Acute onset right lower quadrant pain beginning this morning. Nausea and vomiting. Leukocytosis. EXAM: CT ABDOMEN AND PELVIS WITH CONTRAST TECHNIQUE: Multidetector CT imaging of the abdomen and pelvis was performed using the standard protocol following bolus administration of intravenous contrast. CONTRAST:  170mL OMNIPAQUE IOHEXOL 300 MG/ML  SOLN COMPARISON:  None. FINDINGS: Lower Chest: No acute findings. Hepatobiliary: No hepatic masses identified. Gallbladder is unremarkable. No evidence of biliary ductal dilatation. Pancreas:  No mass or inflammatory changes. Spleen: Within normal limits in size and appearance. Adrenals/Urinary Tract: No masses identified. A few tiny sub-cm renal cysts are seen bilaterally. No evidence of ureteral calculi or hydronephrosis. Stomach/Bowel:  Findings of acute appendicitis are seen as follows: Appendix: Location- Standard Diameter-12 mm, with mild-to-moderate periappendiceal inflammatory changes Appendicolith- Absent Mucosal hyper-enhancement- Present Extraluminal Gas- Absent Periappendiceal Collection- None Vascular/Lymphatic: No pathologically enlarged lymph nodes. No abdominal aortic aneurysm. Aortic atherosclerosis noted. Reproductive:  No mass or other significant abnormality. Other:  None. Musculoskeletal:  No suspicious bone lesions identified. IMPRESSION: Positive for acute appendicitis. No evidence of abscess or other complication. Aortic Atherosclerosis (ICD10-I70.0). These results will be called to the ordering  clinician or representative by the Radiologist Assistant, and communication documented in the PACS or [employer]. Electronically Signed   By: Marlaine Hind M.D.   On: 07/19/2019 16:00    Procedures Procedures (including critical care time)  Medications Ordered in UC Medications  sodium chloride 0.9 % bolus 1,000 mL (0 mLs Intravenous Stopped 07/19/19 1610)  ketorolac (TORADOL) 30 MG/ML injection 30 mg (30 mg Intravenous Given 07/19/19 1519)  iohexol (OMNIPAQUE) 300  MG/ML solution 100 mL (100 mLs Intravenous Contrast Given 07/19/19 1534)    Initial Impression / Assessment and Plan / UC Course  I have reviewed the triage vital signs and the nursing notes.  Pertinent labs & imaging results that were available during my care of the patient were reviewed by me and considered in my medical decision making (see chart for details).    54 year old male presents with acute appendicitis.  Acute emergency.  Patient given IV fluids and IV Toradol here.  Patient is going via private vehicle to the hospital for admission.  Patient hemodynamically stable at this time.  ER was notified as was Surgeon Dr. Bary Castilla.   Final Clinical Impressions(s) / UC Diagnoses   Final diagnoses:  Acute appendicitis, unspecified acute appendicitis type     Discharge Instructions     Straight to hospital.  Take care  Dr. Lacinda Axon     ED Prescriptions    None     PDMP not reviewed this encounter.   Coral Spikes, [place] 07/19/19 1615

## 2019-07-19 NOTE — H&P (Signed)
Derek Torres [id number] 05-22-1965     HPI:  Previously healthy 54 y/o male awakened from sleep with abdominal pain. Progressed as the early AM hours when on. No previous episodes.  Wife and patient were in [place], [place] celebrating his birthday. Drive home was uncomfortable.  No relief w/ emesis ( biliious).    Seen in [facility]. CT/Labs reviewed below.  No regular medications except nasal inhaler.  No medical allergies.  Colonoscopy in December 2020 was unremarkably by patient report.    (Not in a hospital admission)  No Known Allergies Past Medical History:  Diagnosis Date  . Allergic rhinitis   . Hepatitis   . IBS (irritable bowel syndrome)    Past Surgical History:  Procedure Laterality Date  . GANGLION CYST EXCISION     Social History   Socioeconomic History  . Marital status: Married    Spouse name: Not on file  . Number of children: Not on file  . Years of education: Not on file  . Highest education level: Not on file  Occupational History  . Not on file  Tobacco Use  . Smoking status: Never Smoker  . Smokeless tobacco: Never Used  Substance and Sexual Activity  . Alcohol use: Yes    Alcohol/week: 13.0 standard drinks    Types: 1 Glasses of wine, 10 Cans of beer, 2 Shots of liquor per week    Comment: occasionally  . Drug use: Yes    Types: Marijuana    Comment: Every day,  Twice daily   . Sexual activity: Not on file  Other Topics Concern  . Not on file  Social History Narrative  . Not on file   Social Determinants of Health   Financial Resource Strain:   . Difficulty of Paying Living Expenses:   Food Insecurity:   . Worried About Charity fundraiser in the Last Year:   . Arboriculturist in the Last Year:   Transportation Needs:   . Film/video editor (Medical):   Marland Kitchen Lack of Transportation (Non-Medical):   Physical Activity:   . Days of Exercise per Week:   . Minutes of Exercise per Session:   Stress:   . Feeling of Stress :    Social Connections:   . Frequency of Communication with Friends and Family:   . Frequency of Social Gatherings with Friends and Family:   . Attends Religious Services:   . Active Member of Clubs or Organizations:   . Attends Archivist Meetings:   Marland Kitchen Marital Status:   Intimate Partner Violence:   . Fear of Current or Ex-Partner:   . Emotionally Abused:   Marland Kitchen Physically Abused:   . Sexually Abused:    Social History   Social History Narrative  . Not on file     ROS: Negative.     PE: HEENT: Negative. Lungs: Clear. Cardio: RR. Abd: Non-distended. Modest focal tenderness in the RLQ (S/P Toradol at urgent care).   CT/Labs:  WBC: 15K, HGB 16.2, Platelets: 157K Renal normal, non-fasting BS 154. Normal LFT's. CT: Acute appendicitis.  Large, dilated appendix without evidence of rupture.     Assessment/Plan:  Proceed with appendectomy.   Reviewed plans for a laparoscopic procedure, open if necessary.   Forest Gleason [facility] 07/19/2019

## 2019-07-20 DIAGNOSIS — K567 Ileus, unspecified: Secondary | ICD-10-CM | POA: Diagnosis not present

## 2019-07-20 MED ORDER — HYDROCODONE-ACETAMINOPHEN 5-325 MG PO TABS: 1.0000 | ORAL_TABLET | ORAL | 0 refills | Status: AC | PRN

## 2019-07-20 NOTE — Anesthesia Postprocedure Evaluation (Signed)
Anesthesia Post Note  Patient: Derek Torres  Procedure(s) Performed: APPENDECTOMY LAPAROSCOPIC (N/A Abdomen)  Patient location during evaluation: PACU Anesthesia Type: General Level of consciousness: awake and alert Pain management: pain level controlled Vital Signs Assessment: post-procedure vital signs reviewed and stable Respiratory status: spontaneous breathing, nonlabored ventilation and respiratory function stable Cardiovascular status: blood pressure returned to baseline and stable Postop Assessment: no apparent nausea or vomiting Anesthetic complications: no     Last Vitals:  Vitals:   07/19/19 2330 07/20/19 0513  BP: 135/85 127/77  Pulse: 72 65  Resp: 16 16  Temp: 37.1 C 37.1 C  SpO2: 97% 96%    Last Pain:  Vitals:   07/20/19 0513  TempSrc: Oral  PainSc:                  Tera Mater

## 2019-07-20 NOTE — Progress Notes (Signed)
AVSS. ( Tmax 99.5 10 pm). Comfortable. Nausea resolved. Tolerating liquids. Lungs: Clear. ABD: Soft. BS+. Dressings: Dry.  IMP: Doing well. Plan: Advance diet. Tentative d/c this afternoon.

## 2019-07-20 NOTE — Plan of Care (Signed)
The patient has been discharged. Oxygen level was above >93% upon discharge. Education completed with the patient and wife. IV removed.  Problem: Education: Goal: Knowledge of General Education information will improve Description: Including pain rating scale, medication(s)/side effects and non-pharmacologic comfort measures Outcome: Completed/Met   Problem: Health Behavior/Discharge Planning: Goal: Ability to manage health-related needs will improve Outcome: Completed/Met   Problem: Clinical Measurements: Goal: Ability to maintain clinical measurements within normal limits will improve Outcome: Completed/Met Goal: Will remain free from infection Outcome: Completed/Met Goal: Diagnostic test results will improve Outcome: Completed/Met Goal: Respiratory complications will improve Outcome: Completed/Met Goal: Cardiovascular complication will be avoided Outcome: Completed/Met   Problem: Activity: Goal: Risk for activity intolerance will decrease Outcome: Completed/Met   Problem: Nutrition: Goal: Adequate nutrition will be maintained Outcome: Completed/Met   Problem: Coping: Goal: Level of anxiety will decrease Outcome: Completed/Met   Problem: Elimination: Goal: Will not experience complications related to bowel motility Outcome: Completed/Met Goal: Will not experience complications related to urinary retention Outcome: Completed/Met   Problem: Pain Managment: Goal: General experience of comfort will improve Outcome: Completed/Met   Problem: Safety: Goal: Ability to remain free from injury will improve Outcome: Completed/Met   Problem: Skin Integrity: Goal: Risk for impaired skin integrity will decrease Outcome: Completed/Met

## 2019-07-21 LAB — SURGICAL PATHOLOGY

## 2019-07-21 NOTE — Discharge Summary (Signed)
Physician Discharge Summary  Patient ID: Derek Torres MRN: [id number] DOB/AGE: 08/08/1965 54 y.o.  Admit date: 07/19/2019 Discharge date: 07/21/2019  Admission Diagnoses: Acute appendicitis   Discharge Diagnoses:  Active Problems:   Appendicitis   Discharged Condition: good  Hospital Course: Patient underwent laparoscopy shortly after admission. Marked appendiceal inflammation.  Tolerated liquids well. No clinical distension. Candidate for early discharge.   Consults: None  Significant Diagnostic Studies: CT  Treatments: surgery: Appendectomy  Discharge Exam: Blood pressure 131/85, pulse 83, temperature 99.1 F (37.3 C), temperature source Oral, resp. rate 16, height 5\' 11"  (1.803 m), weight 86.2 kg, SpO2 98 %. General appearance: alert and cooperative Resp: clear to auscultation bilaterally Cardio: regular rate and rhythm, S1, S2 normal, no murmur, click, rub or gallop GI: soft, non-tender; bowel sounds normal; no masses,  no organomegaly Incision/Wound: Scant drainage on dressings.    Pathology:  Acute appendicitis without perforation or malignancy.   Disposition: Discharge disposition: 01-Home or Self Care       Discharge Instructions    Diet - low sodium heart healthy   Complete by: As directed    Discharge instructions   Complete by: As directed    OK to shower at any time.  Outer plastic dressing may be removed in three days.  Heating pad to abdomen for comfort.  Keep your fluids up.  Diet as tolerated.  Avoid raw fruits and vegetables for the next few days.  Tylenol/ Advil/ Aleve: If needed for soreness.  Norco (hydrocodone): If needed for pain.  This medication may constipate.  No driving until pain free.  Avoid lifting over 10 pounds for the next two weeks.   Increase activity slowly   Complete by: As directed      Allergies as of 07/20/2019   No Known Allergies     Medication List    TAKE these medications   fluticasone 50 MCG/ACT  nasal spray Commonly known as: FLONASE Place 2 sprays into both nostrils daily as needed for allergies or rhinitis.   HYDROcodone-acetaminophen 5-325 MG tablet Commonly known as: NORCO/VICODIN Take 1 tablet by mouth every 4 (four) hours as needed for moderate pain.   naproxen sodium 220 MG tablet Commonly known as: ALEVE Take 220 mg by mouth 2 (two) times daily as needed (mild pain).   sildenafil 50 MG tablet Commonly known as: VIAGRA Take 50 mg by mouth daily as needed for erectile dysfunction.   tiZANidine 2 MG tablet Commonly known as: ZANAFLEX Take 2 mg by mouth at bedtime as needed for muscle spasms.   vitamin B-12 1000 MCG tablet Commonly known as: CYANOCOBALAMIN Take 1,000 mcg by mouth daily.      Follow-up Information    Finch Costanzo, Forest Gleason, MD. Go on 07/28/2019.   Specialties: General Surgery, Radiology Why: 1pm appointment Contact information: [address] [place] [address] [phone]           Signed: Robert Bellow 07/21/2019, 5:08 PM

## 2019-07-23 ENCOUNTER — Emergency Department: Payer: 59

## 2019-07-23 ENCOUNTER — Other Ambulatory Visit: Payer: Self-pay

## 2019-07-23 ENCOUNTER — Encounter: Payer: Self-pay | Admitting: Emergency Medicine

## 2019-07-23 ENCOUNTER — Inpatient Hospital Stay
Admission: EM | Admit: 2019-07-23 | Discharge: 2019-07-30 | DRG: 389 | Disposition: A | Discharge status: Home / Self Care | Payer: 59 | Attending: General Surgery | Admitting: General Surgery

## 2019-07-23 DIAGNOSIS — K589 Irritable bowel syndrome without diarrhea: Secondary | ICD-10-CM | POA: Diagnosis present

## 2019-07-23 DIAGNOSIS — F419 Anxiety disorder, unspecified: Secondary | ICD-10-CM | POA: Diagnosis present

## 2019-07-23 DIAGNOSIS — Z9049 Acquired absence of other specified parts of digestive tract: Secondary | ICD-10-CM

## 2019-07-23 DIAGNOSIS — Z79899 Other long term (current) drug therapy: Secondary | ICD-10-CM

## 2019-07-23 DIAGNOSIS — Z79891 Long term (current) use of opiate analgesic: Secondary | ICD-10-CM | POA: Diagnosis not present

## 2019-07-23 DIAGNOSIS — K759 Inflammatory liver disease, unspecified: Secondary | ICD-10-CM | POA: Diagnosis present

## 2019-07-23 DIAGNOSIS — J309 Allergic rhinitis, unspecified: Secondary | ICD-10-CM | POA: Diagnosis present

## 2019-07-23 DIAGNOSIS — K9189 Other postprocedural complications and disorders of digestive system: Secondary | ICD-10-CM | POA: Diagnosis present

## 2019-07-23 DIAGNOSIS — K567 Ileus, unspecified: Secondary | ICD-10-CM | POA: Diagnosis present

## 2019-07-23 LAB — CBC WITH DIFFERENTIAL/PLATELET
Abs Immature Granulocytes: 0.1 10*3/uL — ABNORMAL HIGH (ref 0.00–0.07)
Basophils Absolute: 0.1 10*3/uL (ref 0.0–0.1)
Basophils Relative: 1 %
Eosinophils Absolute: 0.2 10*3/uL (ref 0.0–0.5)
Eosinophils Relative: 1 %
HCT: 46.9 % (ref 39.0–52.0)
Hemoglobin: 16.1 g/dL (ref 13.0–17.0)
Immature Granulocytes: 1 %
Lymphocytes Relative: 9 %
Lymphs Abs: 1.1 10*3/uL (ref 0.7–4.0)
MCH: 30.8 pg (ref 26.0–34.0)
MCHC: 34.3 g/dL (ref 30.0–36.0)
MCV: 89.8 fL (ref 80.0–100.0)
Monocytes Absolute: 0.9 10*3/uL (ref 0.1–1.0)
Monocytes Relative: 7 %
Neutro Abs: 10.6 10*3/uL — ABNORMAL HIGH (ref 1.7–7.7)
Neutrophils Relative %: 81 %
Platelets: 233 10*3/uL (ref 150–400)
RBC: 5.22 MIL/uL (ref 4.22–5.81)
RDW: 11.6 % (ref 11.5–15.5)
WBC: 12.9 10*3/uL — ABNORMAL HIGH (ref 4.0–10.5)
nRBC: 0 % (ref 0.0–0.2)

## 2019-07-23 LAB — BASIC METABOLIC PANEL
Anion gap: 12 (ref 5–15)
BUN: 15 mg/dL (ref 6–20)
CO2: 28 mmol/L (ref 22–32)
Calcium: 9.1 mg/dL (ref 8.9–10.3)
Chloride: 94 mmol/L — ABNORMAL LOW (ref 98–111)
Creatinine, Ser: 0.75 mg/dL (ref 0.61–1.24)
GFR calc Af Amer: >60 mL/min (ref >60)
GFR calc non Af Amer: >60 mL/min (ref >60)
Glucose, Bld: 135 mg/dL — ABNORMAL HIGH (ref 70–99)
Potassium: 3.9 mmol/L (ref 3.5–5.1)
Sodium: 134 mmol/L — ABNORMAL LOW (ref 135–145)

## 2019-07-23 LAB — LACTIC ACID, PLASMA: Lactic Acid, Venous: 1.6 mmol/L (ref 0.5–1.9)

## 2019-07-23 MED ORDER — HYDROCODONE-ACETAMINOPHEN 5-325 MG PO TABS: 1.0000 | ORAL_TABLET | ORAL | Status: DC | PRN

## 2019-07-23 MED ORDER — ACETAMINOPHEN 650 MG RE SUPP
650.0000 mg | RECTAL | Status: DC | PRN
  Administered 2019-07-23: 650 mg via RECTAL
  Filled 2019-07-23: qty 1

## 2019-07-23 MED ORDER — SILDENAFIL CITRATE 50 MG PO TABS: 50.0000 mg | ORAL_TABLET | Freq: Every day | ORAL | Status: DC | PRN

## 2019-07-23 MED ORDER — NAPROXEN 250 MG PO TABS
250.0000 mg | ORAL_TABLET | Freq: Two times a day (BID) | ORAL | Status: DC | PRN
  Filled 2019-07-23: qty 1

## 2019-07-23 MED ORDER — MORPHINE SULFATE (PF) 2 MG/ML IV SOLN
2.0000 mg | INTRAVENOUS | Status: DC | PRN
  Administered 2019-07-24 – 2019-07-25 (×2): 2 mg via INTRAVENOUS
  Filled 2019-07-23 (×3): qty 1

## 2019-07-23 MED ORDER — BUTAMBEN-TETRACAINE-BENZOCAINE 2-2-14 % EX AERO
3.0000 | INHALATION_SPRAY | Freq: Once | CUTANEOUS | Status: AC
  Administered 2019-07-23: 3 via TOPICAL
  Filled 2019-07-23: qty 5

## 2019-07-23 MED ORDER — VITAMIN B-12 1000 MCG PO TABS
1000.0000 ug | ORAL_TABLET | Freq: Every day | ORAL | Status: DC
  Filled 2019-07-23: qty 1

## 2019-07-23 MED ORDER — PROMETHAZINE HCL 25 MG/ML IJ SOLN
12.5000 mg | INTRAMUSCULAR | Status: DC | PRN
  Administered 2019-07-23 – 2019-07-28 (×7): 12.5 mg via INTRAVENOUS
  Filled 2019-07-23 (×8): qty 1

## 2019-07-23 MED ORDER — ENOXAPARIN SODIUM 40 MG/0.4ML ~~LOC~~ SOLN
40.0000 mg | SUBCUTANEOUS | Status: DC
  Filled 2019-07-23 (×3): qty 0.4

## 2019-07-23 MED ORDER — ONDANSETRON HCL 4 MG/2ML IJ SOLN
4.0000 mg | Freq: Once | INTRAMUSCULAR | Status: AC
  Administered 2019-07-23: 4 mg via INTRAVENOUS
  Filled 2019-07-23: qty 2

## 2019-07-23 MED ORDER — FLUTICASONE PROPIONATE 50 MCG/ACT NA SUSP
2.0000 | Freq: Every day | NASAL | Status: DC | PRN
  Filled 2019-07-23: qty 16

## 2019-07-23 MED ORDER — TIZANIDINE HCL 2 MG PO TABS
2.0000 mg | ORAL_TABLET | Freq: Every evening | ORAL | Status: DC | PRN
  Filled 2019-07-23: qty 1

## 2019-07-23 MED ORDER — POTASSIUM CHLORIDE IN NACL 40-0.9 MEQ/L-% IV SOLN
INTRAVENOUS | Status: DC
  Administered 2019-07-24 – 2019-07-25 (×3): 75 mL/h via INTRAVENOUS
  Filled 2019-07-23 (×6): qty 1000

## 2019-07-23 MED ORDER — POTASSIUM CHLORIDE IN NACL 40-0.9 MEQ/L-% IV SOLN
INTRAVENOUS | Status: DC
  Administered 2019-07-23 – 2019-07-24 (×2): 100 mL/h via INTRAVENOUS
  Filled 2019-07-23 (×5): qty 1000

## 2019-07-23 MED ORDER — MORPHINE SULFATE (PF) 4 MG/ML IV SOLN
4.0000 mg | Freq: Once | INTRAVENOUS | Status: AC
  Administered 2019-07-23: 4 mg via INTRAVENOUS
  Filled 2019-07-23: qty 1

## 2019-07-23 NOTE — H&P (Signed)
Derek Torres is an 54 y.o. male.   Chief Complaint: Abdominal distension.  HPI: 4 s/p laparoscopic appendectomy for acute appendicitis. Had been tolerating liquids well and passing gas until today. Increased abdominal distension noted. Exam consistent with ileus.   Past Medical History:  Diagnosis Date  . Allergic rhinitis   . Hepatitis   . IBS (irritable bowel syndrome)     Past Surgical History:  Procedure Laterality Date  . GANGLION CYST EXCISION    . LAPAROSCOPIC APPENDECTOMY N/A 07/19/2019   Procedure: APPENDECTOMY LAPAROSCOPIC;  Surgeon: Robert Bellow, MD;  Location: [facility] ORS;  Service: General;  Laterality: N/A;    Family History  Problem Relation Age of Onset  . Cancer Maternal Grandmother        Pancreatic  . Cancer Maternal Grandfather        Skin Cancer   Social History:  reports that he has never smoked. He has never used smokeless tobacco. He reports current alcohol use of about 13.0 standard drinks of alcohol per week. He reports current drug use. Drug: Marijuana.  Allergies: No Known Allergies  (Not in a hospital admission)   Results for orders placed or performed during the hospital encounter of 07/23/19 (from the past 48 hour(s))  CBC with Differential     Status: Abnormal   Collection Time: 07/23/19  5:05 PM  Result Value Ref Range   WBC 12.9 (H) 4.0 - 10.5 K/uL   RBC 5.22 4.22 - 5.81 MIL/uL   Hemoglobin 16.1 13.0 - 17.0 g/dL   HCT 46.9 39.0 - 52.0 %   MCV 89.8 80.0 - 100.0 fL   MCH 30.8 26.0 - 34.0 pg   MCHC 34.3 30.0 - 36.0 g/dL   RDW 11.6 11.5 - 15.5 %   Platelets 233 150 - 400 K/uL   nRBC 0.0 0.0 - 0.2 %   Neutrophils Relative % 81 %   Neutro Abs 10.6 (H) 1.7 - 7.7 K/uL   Lymphocytes Relative 9 %   Lymphs Abs 1.1 0.7 - 4.0 K/uL   Monocytes Relative 7 %   Monocytes Absolute 0.9 0.1 - 1.0 K/uL   Eosinophils Relative 1 %   Eosinophils Absolute 0.2 0.0 - 0.5 K/uL   Basophils Relative 1 %   Basophils Absolute 0.1 0.0 - 0.1 K/uL   Immature Granulocytes 1 %   Abs Immature Granulocytes 0.10 (H) 0.00 - 0.07 K/uL    Comment: Performed at [facility], [address] [place], [place] [id number]  Basic metabolic panel     Status: Abnormal   Collection Time: 07/23/19  5:05 PM  Result Value Ref Range   Sodium 134 (L) 135 - 145 mmol/L   Potassium 3.9 3.5 - 5.1 mmol/L   Chloride 94 (L) 98 - 111 mmol/L   CO2 28 22 - 32 mmol/L   Glucose, Bld 135 (H) 70 - 99 mg/dL    Comment: Glucose reference range applies only to samples taken after fasting for at least 8 hours.   BUN 15 6 - 20 mg/dL   Creatinine, Ser 0.75 0.61 - 1.24 mg/dL   Calcium 9.1 8.9 - 10.3 mg/dL   GFR calc non Af Amer >60 >60 mL/min   GFR calc Af Amer >60 >60 mL/min   Anion gap 12 5 - 15    Comment: Performed at [facility], [address] [place], [place] [address]  Lactic acid, plasma     Status: None   Collection Time: 07/23/19  5:08  PM  Result Value Ref Range   Lactic Acid, Venous 1.6 0.5 - 1.9 mmol/L    Comment: Performed at [facility], [address] [place], [place] [address]   DG ABD ACUTE 2+V W 1V CHEST  Result Date: 07/23/2019 CLINICAL DATA:  Abdominal pain, distention EXAM: DG ABDOMEN ACUTE W/ 1V CHEST COMPARISON:  None. FINDINGS: Gaseous distention of small bowel loops in the abdomen and pelvis concerning for distal small bowel obstruction. Little colonic gas seen. No organomegaly or free air. No confluent airspace opacity or effusion.  Heart is normal size. No acute bony abnormality. IMPRESSION: Gaseous distention of small bowel in the abdomen and pelvis concerning for distal small bowel obstruction Electronically Signed   By: Rolm Baptise M.D.   On: 07/23/2019 18:00    Review of Systems  Constitutional: Negative.   HENT: Negative.   Respiratory: Positive for shortness of breath (hard to take a deep breath. ).   Cardiovascular: Negative.   Gastrointestinal: Positive for abdominal distention and nausea.    Endocrine: Negative.   Genitourinary: Negative.   Musculoskeletal: Negative.   Allergic/Immunologic: Negative.   Neurological: Negative.   Hematological: Negative.   Psychiatric/Behavioral: Negative.     Blood pressure (!) 143/112, pulse (!) 111, temperature 97.7 F (36.5 C), temperature source Oral, resp. rate 16, height 5\' 11"  (1.803 m), weight 86.2 kg, SpO2 99 %. Physical Exam  Constitutional: He appears well-developed and well-nourished.  HENT:  Head: Normocephalic and atraumatic.  Eyes: Pupils are equal, round, and reactive to light.  Cardiovascular: Normal rate and regular rhythm.  Respiratory: Effort normal and breath sounds normal.  GI: He exhibits distension. Bowel sounds are decreased. There is no abdominal tenderness.    Musculoskeletal:     Cervical back: Neck supple.     Assessment/Plan Ileus. Plan: IV fluids, NG.   Robert Bellow, MD 07/23/2019, 6:43 PM

## 2019-07-23 NOTE — ED Provider Notes (Signed)
[facility] Emergency Department Provider Note ____________________________________________   First MD Initiated Contact with Patient 07/23/19 1713     (approximate)  I have reviewed the triage vital signs and the nursing notes.   HISTORY  Chief Complaint Abdominal Pain    HPI Derek Torres is a 54 y.o. male with PMH as noted below and status post laparoscopic appendectomy 4 days ago presents with abdominal distention over the last several days, gradual onset, associated with nausea and generalized abdominal discomfort.  He states that he initially passed gas after the surgery, but has now stopped.  He has not had a bowel movement in the last several days.  He denies vomiting or fever.  He states that the surgical site pain has improved and he actually stopped taking his pain medication at home.  Past Medical History:  Diagnosis Date  . Allergic rhinitis   . Hepatitis   . IBS (irritable bowel syndrome)     Patient Active Problem List   Diagnosis Date Noted  . Ileus ([place]) 07/23/2019  . Appendicitis 07/19/2019  . Hepatitis B 12/16/2014  . Thrombocytopenia ([place]) 12/08/2014    Past Surgical History:  Procedure Laterality Date  . GANGLION CYST EXCISION    . LAPAROSCOPIC APPENDECTOMY N/A 07/19/2019   Procedure: APPENDECTOMY LAPAROSCOPIC;  Surgeon: Robert Bellow, MD;  Location: [facility] ORS;  Service: General;  Laterality: N/A;    Prior to Admission medications   Medication Sig Start Date End Date Taking? Authorizing Provider  fluticasone (FLONASE) 50 MCG/ACT nasal spray Place 2 sprays into both nostrils daily as needed for allergies or rhinitis.    [provider]  HYDROcodone-acetaminophen (NORCO/VICODIN) 5-325 MG tablet Take 1 tablet by mouth every 4 (four) hours as needed for moderate pain. 07/20/19   Robert Bellow, MD  naproxen sodium (ALEVE) 220 MG tablet Take 220 mg by mouth 2 (two) times daily as needed (mild pain).    [provider]  sildenafil (VIAGRA) 50 MG tablet Take 50 mg by mouth daily as needed for erectile dysfunction.    [provider]  tiZANidine (ZANAFLEX) 2 MG tablet Take 2 mg by mouth at bedtime as needed for muscle spasms.    [provider]  vitamin B-12 (CYANOCOBALAMIN) 1000 MCG tablet Take 1,000 mcg by mouth daily.    [provider]    Allergies Patient has no known allergies.  Family History  Problem Relation Age of Onset  . Cancer Maternal Grandmother        Pancreatic  . Cancer Maternal Grandfather        Skin Cancer    Social History Social History   Tobacco Use  . Smoking status: Never Smoker  . Smokeless tobacco: Never Used  Substance Use Topics  . Alcohol use: Yes    Alcohol/week: 13.0 standard drinks    Types: 1 Glasses of wine, 10 Cans of beer, 2 Shots of liquor per week    Comment: occasionally  . Drug use: Yes    Types: Marijuana    Comment: Every day,  Twice daily     Review of Systems  Constitutional: No fever. Eyes: No redness. ENT: No sore throat. Cardiovascular: Denies chest pain. Respiratory: Denies shortness of breath. Gastrointestinal: Positive for nausea. Genitourinary: Negative for dysuria.  Musculoskeletal: Negative for back pain. Skin: Negative for rash. Neurological: Negative for headache.   ____________________________________________   PHYSICAL EXAM:  VITAL SIGNS: ED Triage Vitals  Enc Vitals Group  BP 07/23/19 1647 (!) 143/112     Pulse Rate 07/23/19 1647 (!) 111     Resp 07/23/19 1647 16     Temp 07/23/19 1647 97.7 F (36.5 C)     Temp Source 07/23/19 1647 Oral     SpO2 07/23/19 1647 99 %     Weight 07/23/19 1658 190 lb 0.6 oz (86.2 kg)     Height 07/23/19 1658 5\' 11"  (1.803 m)     Head Circumference --      Peak Flow --      Pain Score 07/23/19 1658 8     Pain Loc --      Pain Edu? --      Excl. in [place]? --     Constitutional: Alert and oriented.  Uncomfortable appearing but in no  acute distress. Eyes: Conjunctivae are normal.  No scleral icterus. Head: Atraumatic. Nose: No congestion/rhinnorhea. Mouth/Throat: Mucous membranes are slightly dry.   Neck: Normal range of motion.  Cardiovascular: Normal rate, regular rhythm. Good peripheral circulation. Respiratory: Normal respiratory effort.  No retractions.  Gastrointestinal: Moderate distention.  Soft with mild diffuse discomfort but no focal tenderness or peritoneal signs. Genitourinary: No flank tenderness. Musculoskeletal: Extremities warm and well perfused.  Neurologic:  Normal speech and language. No gross focal neurologic deficits are appreciated.  Skin:  Skin is warm and dry. No rash noted. Psychiatric: Mood and affect are normal. Speech and behavior are normal.  ____________________________________________   LABS (all labs ordered are listed, but only abnormal results are displayed)  Labs Reviewed  CBC WITH DIFFERENTIAL/PLATELET - Abnormal; Notable for the following components:      Result Value   WBC 12.9 (*)    Neutro Abs 10.6 (*)    Abs Immature Granulocytes 0.10 (*)    All other components within normal limits  BASIC METABOLIC PANEL - Abnormal; Notable for the following components:   Sodium 134 (*)    Chloride 94 (*)    Glucose, Bld 135 (*)    All other components within normal limits  LACTIC ACID, PLASMA  LACTIC ACID, PLASMA   ____________________________________________  EKG   ____________________________________________  RADIOLOGY  XR abdomen: Distended loops of bowel consistent with ileus.  No free air.  ____________________________________________   PROCEDURES  Procedure(s) performed: No  Procedures  Critical Care performed: No ____________________________________________   INITIAL IMPRESSION / ASSESSMENT AND PLAN / ED COURSE  Pertinent labs & imaging results that were available during my care of the patient were reviewed by me and considered in my medical decision  making (see chart for details).  54 year old male with PMH as noted above and status post laparoscopic appendectomy 4 days ago presents with abdominal discomfort and distention over the last several days, although he states that the surgical site pain has improved.  He has nausea but no vomiting, and states that he stopped passing gas.  On exam, the abdomen is moderately distended.  His vital signs are normal except for tachycardia.  The physical exam is otherwise unremarkable.  Presentation is concerning for ileus versus possible SBO.  I have a low suspicion for perforation.  Will obtain labs, x-ray, and consult surgery.  ----------------------------------------- 5:54 PM on 07/23/2019 -----------------------------------------  I consulted with Dr. Tollie Pizza from surgery, who has seen the patient in the ED.  He recommends admission and treatment with an NG tube for likely ileus.  ____________________________________________   FINAL CLINICAL IMPRESSION(S) / ED DIAGNOSES  Final diagnoses:  Ileus following gastrointestinal surgery ([place])  NEW MEDICATIONS STARTED DURING THIS VISIT:  New Prescriptions   No medications on file     Note:  This document was prepared using Dragon voice recognition software and may include unintentional dictation errors.   Arta Silence, MD 07/23/19 1757

## 2019-07-23 NOTE — ED Triage Notes (Signed)
C/O abdominal pain and distention.  Had abdominal surgery on Sunday and discharged home on Monday.

## 2019-07-23 NOTE — ED Notes (Signed)
65F NG tube placed per orders to rt nare at 58cm.  Secured to nose and confirmed placement with asucultation.  PT tolerated well and tube placed on intermittent suction.

## 2019-07-23 NOTE — ED Notes (Signed)
NG placed to intermittent suction. Pt with c/o headache, [facility] NP notified for request for pain meds. Pt requested hospital bed, charge nurse notified, will put in request, pt given warm blankets.

## 2019-07-24 ENCOUNTER — Inpatient Hospital Stay: Payer: 59

## 2019-07-24 ENCOUNTER — Other Ambulatory Visit: Payer: Self-pay

## 2019-07-24 LAB — BASIC METABOLIC PANEL
Anion gap: 12 (ref 5–15)
BUN: 19 mg/dL (ref 6–20)
CO2: 27 mmol/L (ref 22–32)
Calcium: 8.8 mg/dL — ABNORMAL LOW (ref 8.9–10.3)
Chloride: 99 mmol/L (ref 98–111)
Creatinine, Ser: 0.73 mg/dL (ref 0.61–1.24)
GFR calc Af Amer: >60 mL/min (ref >60)
GFR calc non Af Amer: >60 mL/min (ref >60)
Glucose, Bld: 123 mg/dL — ABNORMAL HIGH (ref 70–99)
Potassium: 4.2 mmol/L (ref 3.5–5.1)
Sodium: 138 mmol/L (ref 135–145)

## 2019-07-24 LAB — CBC WITH DIFFERENTIAL/PLATELET
Abs Immature Granulocytes: 0.06 10*3/uL (ref 0.00–0.07)
Basophils Absolute: 0 10*3/uL (ref 0.0–0.1)
Basophils Relative: 0 %
Eosinophils Absolute: 0.2 10*3/uL (ref 0.0–0.5)
Eosinophils Relative: 2 %
HCT: 43.6 % (ref 39.0–52.0)
Hemoglobin: 15.3 g/dL (ref 13.0–17.0)
Immature Granulocytes: 1 %
Lymphocytes Relative: 13 %
Lymphs Abs: 1.3 10*3/uL (ref 0.7–4.0)
MCH: 31.2 pg (ref 26.0–34.0)
MCHC: 35.1 g/dL (ref 30.0–36.0)
MCV: 88.8 fL (ref 80.0–100.0)
Monocytes Absolute: 0.8 10*3/uL (ref 0.1–1.0)
Monocytes Relative: 8 %
Neutro Abs: 7.8 10*3/uL — ABNORMAL HIGH (ref 1.7–7.7)
Neutrophils Relative %: 76 %
Platelets: 238 10*3/uL (ref 150–400)
RBC: 4.91 MIL/uL (ref 4.22–5.81)
RDW: 11.9 % (ref 11.5–15.5)
WBC: 10.1 10*3/uL (ref 4.0–10.5)
nRBC: 0 % (ref 0.0–0.2)

## 2019-07-24 MED ORDER — PHENOL 1.4 % MT LIQD
1.0000 | OROMUCOSAL | Status: DC | PRN
  Filled 2019-07-24: qty 177

## 2019-07-24 MED ORDER — ONDANSETRON HCL 4 MG/2ML IJ SOLN
4.0000 mg | INTRAMUSCULAR | Status: DC | PRN
  Administered 2019-07-24 – 2019-07-28 (×4): 4 mg via INTRAVENOUS
  Filled 2019-07-24 (×4): qty 2

## 2019-07-24 MED ORDER — MENTHOL 3 MG MT LOZG
1.0000 | LOZENGE | OROMUCOSAL | Status: DC | PRN
  Filled 2019-07-24: qty 9

## 2019-07-24 MED ORDER — KETOROLAC TROMETHAMINE 30 MG/ML IJ SOLN
30.0000 mg | Freq: Once | INTRAMUSCULAR | Status: AC
  Administered 2019-07-24: 30 mg via INTRAVENOUS
  Filled 2019-07-24: qty 1

## 2019-07-24 MED ORDER — KETOROLAC TROMETHAMINE 15 MG/ML IJ SOLN: 15.0000 mg | Freq: Four times a day (QID) | INTRAMUSCULAR | Status: DC | PRN

## 2019-07-24 NOTE — Progress Notes (Signed)
Plain films showed resolution of dilated stomach, but perhaps slightly more pronounce SB dilatation.  CT reviewed. No abscess. Some free fluid/ tiny bit of residual air consistent w/ irrigation.  ABD: Soft.  IMP: Ileus.  Plan: Ambulation, NG, Fluids.

## 2019-07-24 NOTE — ED Notes (Signed)
Pt states had a medium size BM.

## 2019-07-24 NOTE — ED Notes (Signed)
Pt called out, states feels like he needs to have BM.  Assisted to bathroom, pt with steady gait.

## 2019-07-24 NOTE — ED Notes (Signed)
Assigned bed @ 1408, spoke with RN Opal Sidles.

## 2019-07-24 NOTE — Progress Notes (Signed)
Afebrile. BP up.  Mild tachycardia. Reports BM x 2. Marked relief of abdominal discomfort and difficulty taking a deep breath after NG passed.  I&O record not available. Lungs:Clear. Cardio: RR. ABD: Moderate distension, tympanitic. Non-tender. IMP: Ileus s/p appendectomy. Plan: Repeat KUB this AM. Possible non-contrast CT.  Reviewed EtOH usage.  Anxiety reported last PM has resolved.

## 2019-07-25 ENCOUNTER — Inpatient Hospital Stay: Payer: 59

## 2019-07-25 LAB — BASIC METABOLIC PANEL
Anion gap: 11 (ref 5–15)
BUN: 25 mg/dL — ABNORMAL HIGH (ref 6–20)
CO2: 26 mmol/L (ref 22–32)
Calcium: 8.5 mg/dL — ABNORMAL LOW (ref 8.9–10.3)
Chloride: 104 mmol/L (ref 98–111)
Creatinine, Ser: 0.79 mg/dL (ref 0.61–1.24)
GFR calc Af Amer: >60 mL/min (ref >60)
GFR calc non Af Amer: >60 mL/min (ref >60)
Glucose, Bld: 116 mg/dL — ABNORMAL HIGH (ref 70–99)
Potassium: 4.4 mmol/L (ref 3.5–5.1)
Sodium: 141 mmol/L (ref 135–145)

## 2019-07-25 LAB — CBC WITH DIFFERENTIAL/PLATELET
Abs Immature Granulocytes: 0.05 10*3/uL (ref 0.00–0.07)
Basophils Absolute: 0 10*3/uL (ref 0.0–0.1)
Basophils Relative: 0 %
Eosinophils Absolute: 0.1 10*3/uL (ref 0.0–0.5)
Eosinophils Relative: 2 %
HCT: 38.4 % — ABNORMAL LOW (ref 39.0–52.0)
Hemoglobin: 12.8 g/dL — ABNORMAL LOW (ref 13.0–17.0)
Immature Granulocytes: 1 %
Lymphocytes Relative: 13 %
Lymphs Abs: 0.9 10*3/uL (ref 0.7–4.0)
MCH: 31.1 pg (ref 26.0–34.0)
MCHC: 33.3 g/dL (ref 30.0–36.0)
MCV: 93.4 fL (ref 80.0–100.0)
Monocytes Absolute: 0.8 10*3/uL (ref 0.1–1.0)
Monocytes Relative: 12 %
Neutro Abs: 5.2 10*3/uL (ref 1.7–7.7)
Neutrophils Relative %: 72 %
Platelets: 204 10*3/uL (ref 150–400)
RBC: 4.11 MIL/uL — ABNORMAL LOW (ref 4.22–5.81)
RDW: 12 % (ref 11.5–15.5)
WBC: 7.1 10*3/uL (ref 4.0–10.5)
nRBC: 0 % (ref 0.0–0.2)

## 2019-07-25 MED ORDER — FAMOTIDINE IN NACL 20-0.9 MG/50ML-% IV SOLN
20.0000 mg | Freq: Two times a day (BID) | INTRAVENOUS | Status: AC
  Administered 2019-07-25 – 2019-07-28 (×7): 20 mg via INTRAVENOUS
  Filled 2019-07-25 (×7): qty 50

## 2019-07-25 MED ORDER — ZOLPIDEM TARTRATE 5 MG PO TABS
5.0000 mg | ORAL_TABLET | Freq: Every evening | ORAL | Status: DC | PRN
  Administered 2019-07-25 – 2019-07-29 (×5): 5 mg via ORAL
  Filled 2019-07-25 (×5): qty 1

## 2019-07-25 NOTE — Progress Notes (Signed)
[facility] Hospital Day(s): 2.   Post op day(s):  Marland Kitchen   Interval History: Patient seen and examined, no acute events or new complaints overnight. Patient reports feeling much better today.  He reports that the abdominal distention has significantly improved.  He report he is passing gas and having bowel movement.  He denies any upper abdominal pain.  He has been ambulating.  There was a concern this morning about bright red blood cell seen in the NGT.  Abdominal x-ray was done showing persistent small bowel dilation.  Vital signs in last 24 hours: [min-max] current  Temp:  [97.8 F (36.6 C)-99.1 F (37.3 C)] 97.9 F (36.6 C) (05/15 0850) Pulse Rate:  [86-97] 87 (05/15 0850) Resp:  [14-20] 14 (05/15 0850) BP: (128-164)/(68-102) 155/102 (05/15 0850) SpO2:  [97 %-98 %] 97 % (05/15 0850)     Height: 5\' 11"  (180.3 cm) Weight: 86.2 kg BMI (Calculated): 26.52   Physical Exam:  Constitutional: alert, cooperative and no distress  Respiratory: breathing non-labored at rest  Cardiovascular: regular rate and sinus rhythm  Gastrointestinal: soft, non-tender, and non-distended.  Wound covered with dressings dry and clean.  Labs:  CBC Latest Ref Rng & Units 07/25/2019 07/24/2019 07/23/2019  WBC 4.0 - 10.5 K/uL 7.1 10.1 12.9(H)  Hemoglobin 13.0 - 17.0 g/dL 12.8(L) 15.3 16.1  Hematocrit 39.0 - 52.0 % 38.4(L) 43.6 46.9  Platelets 150 - 400 K/uL 204 238 233   CMP Latest Ref Rng & Units 07/25/2019 07/24/2019 07/23/2019  Glucose 70 - 99 mg/dL 116(H) 123(H) 135(H)  BUN 6 - 20 mg/dL 25(H) 19 15  Creatinine 0.61 - 1.24 mg/dL 0.79 0.73 0.75  Sodium 135 - 145 mmol/L 141 138 134(L)  Potassium 3.5 - 5.1 mmol/L 4.4 4.2 3.9  Chloride 98 - 111 mmol/L 104 99 94(L)  CO2 22 - 32 mmol/L 26 27 28   Calcium 8.9 - 10.3 mg/dL 8.5(L) 8.8(L) 9.1  Total Protein 6.5 - 8.1 g/dL - - -  Total Bilirubin 0.3 - 1.2 mg/dL - - -  Alkaline Phos 38 - 126 U/L - - -  AST 15 - 41 U/L - - -  ALT 0 - 44 U/L - - -     Imaging studies: Abdominal x-ray shows small bowel dilation.   Assessment/Plan:  54 y.o. male with postoperative ileus s/p laparoscopic appendectomy.  Even though the patient's x-ray this morning shows no significant improvement, clinically he has improved with improved abdominal distention, passing gas and having bowel movement.  The patient is very active and ambulating.  I will clamp the NG and give him a clear liquid trial if he tolerates will remove NG tube later today.  Patient will continue ambulating.  We will continue with DVT prophylaxis.  We will continue with conservative management.  Arnold Long, MD

## 2019-07-25 NOTE — Progress Notes (Signed)
Dr.Byrnett notified of bright red blood per NGT. New orders given to put Ngt to gravity. Terrial Rhodes

## 2019-07-25 NOTE — Progress Notes (Signed)
Patient vomited once. Patient given Zofran. Per Dr. Peyton Najjar place patient NPO and reconnect NGT to intermittent suction.   Fuller Mandril, RN

## 2019-07-25 NOTE — Progress Notes (Signed)
Surgery Follow Up   Patient reevaluated and was found sitting down in a chair.  He endorses that he continue passing multiple gases and multiple bowel movements.  I discussed with the patient the possibility of removing the NG because he is improving clinically.  Upon physical exam the patient continued to be distended.  He does feel that he still bloated and distended.  I think that this patient is having ileus and it will be too premature at this moment to remove the NGT.  I consider that he needs a little bit more time to move better his intestine before removing the NG.  We can keep the clamp as long as he does not have any nausea or vomiting.  Will reevaluate in the morning with physical exam before deciding to remove the NG tube.  Patient oriented about the plan and agreed.

## 2019-07-25 NOTE — Progress Notes (Signed)
Notified of bright red blood in Ng, perhaps 100 cc.  Patient asymptomatic, up ambulating in the hall frequently during the night.  Had been placed on PRN Toradol for headache, NG discomfort.  400 cc NG drainage   Pt receiving ice chips aswell. Suspect bleeding from NG irritation.   Will add IV Pepcid, D/c Toradol and check labs & KUB.  Dr. Tildon Husky will assess on AM rounds.

## 2019-07-26 MED ORDER — KCL IN DEXTROSE-NACL 10-5-0.45 MEQ/L-%-% IV SOLN
INTRAVENOUS | Status: DC
  Filled 2019-07-26 (×2): qty 1000

## 2019-07-26 MED ORDER — METOCLOPRAMIDE HCL 5 MG/ML IJ SOLN
5.0000 mg | Freq: Four times a day (QID) | INTRAMUSCULAR | Status: DC
  Administered 2019-07-26 – 2019-07-27 (×5): 5 mg via INTRAVENOUS
  Filled 2019-07-26 (×5): qty 2

## 2019-07-26 MED ORDER — ACETAMINOPHEN 325 MG PO TABS
650.0000 mg | ORAL_TABLET | ORAL | Status: DC | PRN
  Administered 2019-07-26 – 2019-07-28 (×2): 650 mg via ORAL
  Filled 2019-07-26 (×2): qty 2

## 2019-07-26 MED ORDER — ALPRAZOLAM 0.5 MG PO TABS
0.5000 mg | ORAL_TABLET | Freq: Three times a day (TID) | ORAL | Status: DC | PRN
  Administered 2019-07-26 – 2019-07-27 (×2): 0.5 mg via ORAL
  Filled 2019-07-26 (×2): qty 1

## 2019-07-26 NOTE — Progress Notes (Signed)
Dr. Windell Moment notified of dark red tinged drainage/blood per NGT, per message - acknowledgement of message received.

## 2019-07-26 NOTE — Progress Notes (Signed)
Anxious/frustrated.  No evidence EtOH withdrawel.   Afebrile. VSS. HR down. BP remains mildly elevateed.  Lungs: Clear.  Cardio: RR.  Abd: Less distended, soft, non-tender.  Passing gas.  Reports voiding without difficulty.   Plan: D/C NG, Lovenox.  Hold PO. Check KUB in AM, if necessary, SBFT.  Continue IV Pepcid.

## 2019-07-27 ENCOUNTER — Inpatient Hospital Stay: Payer: 59

## 2019-07-27 LAB — CBC WITH DIFFERENTIAL/PLATELET
Abs Immature Granulocytes: 0.06 10*3/uL (ref 0.00–0.07)
Basophils Absolute: 0 10*3/uL (ref 0.0–0.1)
Basophils Relative: 1 %
Eosinophils Absolute: 0.3 10*3/uL (ref 0.0–0.5)
Eosinophils Relative: 4 %
HCT: 33.2 % — ABNORMAL LOW (ref 39.0–52.0)
Hemoglobin: 11 g/dL — ABNORMAL LOW (ref 13.0–17.0)
Immature Granulocytes: 1 %
Lymphocytes Relative: 18 %
Lymphs Abs: 1.5 10*3/uL (ref 0.7–4.0)
MCH: 31.5 pg (ref 26.0–34.0)
MCHC: 33.1 g/dL (ref 30.0–36.0)
MCV: 95.1 fL (ref 80.0–100.0)
Monocytes Absolute: 0.7 10*3/uL (ref 0.1–1.0)
Monocytes Relative: 9 %
Neutro Abs: 5.6 10*3/uL (ref 1.7–7.7)
Neutrophils Relative %: 67 %
Platelets: 226 10*3/uL (ref 150–400)
RBC: 3.49 MIL/uL — ABNORMAL LOW (ref 4.22–5.81)
RDW: 11.8 % (ref 11.5–15.5)
WBC: 8.2 10*3/uL (ref 4.0–10.5)
nRBC: 0 % (ref 0.0–0.2)

## 2019-07-27 LAB — HEPATIC FUNCTION PANEL
ALT: 28 U/L (ref 0–44)
AST: 17 U/L (ref 15–41)
Albumin: 3.2 g/dL — ABNORMAL LOW (ref 3.5–5.0)
Alkaline Phosphatase: 60 U/L (ref 38–126)
Bilirubin, Direct: 0.1 mg/dL (ref 0.0–0.2)
Indirect Bilirubin: 0.9 mg/dL (ref 0.3–0.9)
Total Bilirubin: 1 mg/dL (ref 0.3–1.2)
Total Protein: 6.1 g/dL — ABNORMAL LOW (ref 6.5–8.1)

## 2019-07-27 LAB — APTT: aPTT: 32 seconds (ref 24–36)

## 2019-07-27 LAB — PROTIME-INR
INR: 1.2 (ref 0.8–1.2)
Prothrombin Time: 14.5 seconds (ref 11.4–15.2)

## 2019-07-27 MED ORDER — METOCLOPRAMIDE HCL 5 MG PO TABS
5.0000 mg | ORAL_TABLET | Freq: Three times a day (TID) | ORAL | Status: DC
  Administered 2019-07-27 – 2019-07-28 (×5): 5 mg via ORAL
  Filled 2019-07-27 (×5): qty 1

## 2019-07-27 MED ORDER — TRAMADOL HCL 50 MG PO TABS
50.0000 mg | ORAL_TABLET | Freq: Four times a day (QID) | ORAL | Status: DC | PRN
  Administered 2019-07-27 – 2019-07-28 (×4): 50 mg via ORAL
  Filled 2019-07-27 (×4): qty 1

## 2019-07-27 NOTE — Progress Notes (Signed)
SBFT films reviewed.  Barium to rectum on last film.  General SM dilatation w/o stricture or evidence of obstruction.   Plan: Slow resumption of po fluids.

## 2019-07-27 NOTE — Progress Notes (Signed)
AVSS. BP down. HR normal. More comfortable today. Passing flatus. No nausea with NG out.  Limited po to ice as requested. Able to cough more effectively. ABD: Less distended, soft.  Non-tender.  Plain films show left colon gas.  Labs reviewed. HGB: down to 11, normal WBC,platelets, clotting studies, modest low albumin (?dilutional) and otherwise normal LFT's.   SBFT in progress.

## 2019-07-28 ENCOUNTER — Inpatient Hospital Stay: Payer: 59

## 2019-07-28 MED ORDER — BOOST / RESOURCE BREEZE PO LIQD CUSTOM
1.0000 | Freq: Three times a day (TID) | ORAL | Status: DC
  Administered 2019-07-28: 1 via ORAL

## 2019-07-28 MED ORDER — PANTOPRAZOLE SODIUM 40 MG PO TBEC
40.0000 mg | DELAYED_RELEASE_TABLET | Freq: Every day | ORAL | Status: DC
  Administered 2019-07-28 – 2019-07-30 (×3): 40 mg via ORAL
  Filled 2019-07-28 (×3): qty 1

## 2019-07-28 NOTE — Progress Notes (Signed)
Initial Nutrition Assessment  DOCUMENTATION CODES:   Not applicable  INTERVENTION:   RD will monitor for diet advancement vs the need for nutrition support  Recommend TPN if unable to advance diet in the next 1-2 days  Pt is likely at high refeed risk; recommend monitor K, Mg and P labs daily until stable.  Boost Breeze po TID per MD, each supplement provides 250 kcal and 9 grams of protein  NUTRITION DIAGNOSIS:   Inadequate oral intake related to acute illness as evidenced by other (comment)(pt on NPO/clear liquid diet x 5 days).  GOAL:   Patient will meet greater than or equal to 90% of their needs  MONITOR:   PO intake, Supplement acceptance, Labs, Weight trends, Skin, I & O's  REASON FOR ASSESSMENT:   NPO/Clear Liquid Diet    ASSESSMENT:   54 y.o. male with h/o hepatitis and IBS who is admitted with postoperative ileus s/p laparoscopic appendectomy on 5/9   Pt with decreased appetite and oral intake for several days pta r/t nausea and abdominal pain. Pt reports that he was able to eat some of his clear liquid tray for lunch today and drank a Boost Breeze but he reports that the Boost upset his stomach. Pt has been on a NPO/clear liquid diet since admit and is now without adequate nutrition for 5 days. RD will monitor for diet advancement vs the need for nutrition support. Would recommend changing from Boost to Ensure as this has less sugar and a lower osmolality. Recommend TPN if unable to advance diet in the next 1-2 days. Pt is at high refeed risk.   Per chart, pt appears fairly weight stable at baseline.   Medications reviewed and include: reglan, protonix  Labs reviewed: K 4.4 wnl, BUN 25(H)  Unable to complete Nutrition-Focused physical exam at this time.   Diet Order:   Diet Order            Diet clear liquid Room service appropriate? Yes; Fluid consistency: Thin  Diet effective now             EDUCATION NEEDS:   No education needs have been  identified at this time  Skin:  Skin Assessment: Reviewed RN Assessment(closed incisions abdomen)  Last BM:  5/17- type 4  Height:   Ht Readings from Last 1 Encounters:  07/23/19 5\' 11"  (1.803 m)    Weight:   Wt Readings from Last 1 Encounters:  07/23/19 86.2 kg    Ideal Body Weight:  78.2 kg  BMI:  Body mass index is 26.5 kg/m.  Estimated Nutritional Needs:   Kcal:  2100-2400kcal/day  Protein:  105-120g/day  Fluid:  >2.3L/day  Koleen Distance MS, RD, LDN Please refer to [facility] for RD and/or RD on-call/weekend/after hours pager

## 2019-07-28 NOTE — Progress Notes (Signed)
Poor tolerance of po today. Bloating after boost breeze. Dietician notes recognized. ABD: Soft, distended. No focal tendenness.  Plan: Check KUB tonight, CBC in AM. Based on preop and post op albumin levels, adequate hydration.

## 2019-07-28 NOTE — Progress Notes (Signed)
AVSS. No further pain. Multiple white bowel movements since last night clearing barium. Lungs: Clear. Cardio: RR. ABD: Soft, mild distension. Non-tender. Wounds: Minimal bruising. Tolerating clear liquids well. Plan: Advance to fortified clear liquids. Change to po acid suppression.

## 2019-07-29 LAB — CBC WITH DIFFERENTIAL/PLATELET
Abs Immature Granulocytes: 0.06 10*3/uL (ref 0.00–0.07)
Basophils Absolute: 0 10*3/uL (ref 0.0–0.1)
Basophils Relative: 0 %
Eosinophils Absolute: 0.3 10*3/uL (ref 0.0–0.5)
Eosinophils Relative: 3 %
HCT: 32.8 % — ABNORMAL LOW (ref 39.0–52.0)
Hemoglobin: 11.3 g/dL — ABNORMAL LOW (ref 13.0–17.0)
Immature Granulocytes: 1 %
Lymphocytes Relative: 19 %
Lymphs Abs: 1.7 10*3/uL (ref 0.7–4.0)
MCH: 31.6 pg (ref 26.0–34.0)
MCHC: 34.5 g/dL (ref 30.0–36.0)
MCV: 91.6 fL (ref 80.0–100.0)
Monocytes Absolute: 0.7 10*3/uL (ref 0.1–1.0)
Monocytes Relative: 7 %
Neutro Abs: 6.3 10*3/uL (ref 1.7–7.7)
Neutrophils Relative %: 70 %
Platelets: 260 10*3/uL (ref 150–400)
RBC: 3.58 MIL/uL — ABNORMAL LOW (ref 4.22–5.81)
RDW: 11.3 % — ABNORMAL LOW (ref 11.5–15.5)
WBC: 9 10*3/uL (ref 4.0–10.5)
nRBC: 0 % (ref 0.0–0.2)

## 2019-07-29 LAB — BASIC METABOLIC PANEL
Anion gap: 10 (ref 5–15)
BUN: 12 mg/dL (ref 6–20)
CO2: 26 mmol/L (ref 22–32)
Calcium: 8.6 mg/dL — ABNORMAL LOW (ref 8.9–10.3)
Chloride: 99 mmol/L (ref 98–111)
Creatinine, Ser: 0.77 mg/dL (ref 0.61–1.24)
GFR calc Af Amer: >60 mL/min (ref >60)
GFR calc non Af Amer: >60 mL/min (ref >60)
Glucose, Bld: 99 mg/dL (ref 70–99)
Potassium: 3.6 mmol/L (ref 3.5–5.1)
Sodium: 135 mmol/L (ref 135–145)

## 2019-07-29 LAB — LIPASE, BLOOD: Lipase: 46 U/L (ref 11–51)

## 2019-07-29 MED ORDER — ERYTHROMYCIN BASE 250 MG PO TBEC
500.0000 mg | DELAYED_RELEASE_TABLET | Freq: Three times a day (TID) | ORAL | Status: DC
  Administered 2019-07-29 – 2019-07-30 (×4): 500 mg via ORAL
  Filled 2019-07-29 (×5): qty 2

## 2019-07-29 NOTE — Progress Notes (Signed)
Good night. No pain. Slept well.  Passing a large amount of flatus, some belching without discomfort. Feels much better. Lungs: Crystal clear. No evidence of pneumonia. Cardio: RR. ABD: Less distended, still tympanitic. Soft, non-tender. LABS: Lipase and lytes, normal. BUN down suggesting adequate hydration without IV fluids.  CBC: Hgb trending up. ( 11.0 now 11.3). Normal WBC and diff. Platelets normal.  Difficult to explain last night's KUB with barium completely in the colon (to the rectum) and still with gastric and small bowel distension.  Taking some liquids, will see how the day progresses.

## 2019-07-30 MED ORDER — ERYTHROMYCIN BASE 500 MG PO TBEC: 500.0000 mg | DELAYED_RELEASE_TABLET | Freq: Three times a day (TID) | ORAL | 0 refills | Status: AC

## 2019-07-30 MED ORDER — PANTOPRAZOLE SODIUM 40 MG PO TBEC: 40.0000 mg | DELAYED_RELEASE_TABLET | Freq: Every day | ORAL | 0 refills | Status: AC

## 2019-07-30 NOTE — Progress Notes (Signed)
Tmax 98.8, VSS. Feeling well. No nausea. Taking liquids well. Passing large amount of flatus. Tolerating PO erythromycin started yesterday for pro-motility effects. ABD: Minimal distension, some tympany. Soft.  Wounds: Clean.  IMP: Resolving proximal/ mid-gut ileus. Plan: Advance diet. Tentative d/c today.

## 2019-07-30 NOTE — Progress Notes (Signed)
Pt discharged per MD order. IV removed. Discharge instructions reviewed with pt. Pt verbalized understanding with all questions answered to pt satisfaction. Pt taken to car in wheelchair by staff.  

## 2019-07-30 NOTE — Final Progress Note (Signed)
Did well with diet advance.    Reviewed dietary restrictions.

## 2019-08-03 NOTE — Discharge Summary (Signed)
Physician Discharge Summary  Patient ID: Levy Bayles MRN: [id number] DOB/AGE: March 22, 1965 54 y.o.  Admit date: 07/23/2019 Discharge date: 08/03/2019  Admission Diagnoses: Postoperative ileus  Discharge Diagnoses:  Active Problems:   [facility])   Discharged Condition: good  Hospital Course: The patient was admitted with marked abdominal distention and plain film showing dilated small bowel and stomach 4 days after undergoing laparoscopic appendectomy for acute appendicitis.  An NG tube was placed with marked relief of his abdominal distress.  At 24 hours post admission a repeat CT scan was obtained to confirm there were no abscesses present.  This again showed dilated small bowel.  The patient developed bright red blood through the NG tube thought secondary to tube trauma and effects of Lovenox.  He was placed on IV H2 blocker and the tube placed to gravity drainage.  It was subsequently removed.  The Lovenox was discontinued due to the bleeding and the fact that the patient was ambulating frequently throughout the day.  His risk for DVT was small.  The patient did show some modest depression when he required readmission and he was treated with oral Ativan with prompt resolution of symptoms.  It was not required at discharge.  The patient had slow progress and on post admission day 4 a small bowel follow-through study was obtained.  This showed barium reaching the right colon in 4 hours.  The following day she will showed small bowel distention although the barium had completely passed through the small bowel.  Reglan was discontinued and he was placed on erythromycin base 500 mg p.o. 3 times daily for its promotility effects.  From this point he showed marked provement.  He was able to tolerate small volumes of clear liquids and was advanced to a soft diet over the last 12 hours.  He tolerated this well and was felt to be a candidate for discharge home.  Consults: None  Significant  Diagnostic Studies: CT/barium small bowel series.  Treatments: NG drainage.  Discharge Exam: Blood pressure 119/83, pulse 95, temperature 97.7 F (36.5 C), temperature source Oral, resp. rate 18, height 5\' 11"  (1.803 m), weight 86.2 kg, SpO2 100 %. General appearance: alert and cooperative Resp: clear to auscultation bilaterally Cardio: regular rate and rhythm, S1, S2 normal, no murmur, click, rub or gallop GI: soft, non-tender; bowel sounds normal; no masses,  no organomegaly Incision/Wound: Clean and dry.  Disposition: Discharge disposition: 01-Home or Self Care       Discharge Instructions    Diet - low sodium heart healthy   Complete by: As directed    Discharge instructions   Complete by: As directed    Avoid raw fruits and vegetables for the next week.  Frequent small meals.   High protein foods are good.  Tylenol: If needed for soreness.  Protonix: Each morning to minimize stomach acid.  Erythromycin: Three times a day to improve stomach emptying.   No driving until comfortable.   Heating pad to abdomen for comfort.   Increase activity slowly   Complete by: As directed      Allergies as of 07/30/2019   No Known Allergies     Medication List    TAKE these medications   erythromycin 500 MG EC tablet Commonly known as: ERY-TAB Take 1 tablet (500 mg total) by mouth 3 (three) times daily for 7 days.   fluticasone 50 MCG/ACT nasal spray Commonly known as: FLONASE Place 2 sprays into both nostrils daily as needed for allergies or  rhinitis.   HYDROcodone-acetaminophen 5-325 MG tablet Commonly known as: NORCO/VICODIN Take 1 tablet by mouth every 4 (four) hours as needed for moderate pain.   naproxen sodium 220 MG tablet Commonly known as: ALEVE Take 220 mg by mouth 2 (two) times daily as needed (mild pain).   pantoprazole 40 MG tablet Commonly known as: PROTONIX Take 1 tablet (40 mg total) by mouth daily.   sildenafil 50 MG tablet Commonly known  as: VIAGRA Take 50 mg by mouth daily as needed for erectile dysfunction.   tiZANidine 2 MG tablet Commonly known as: ZANAFLEX Take 2 mg by mouth at bedtime as needed for muscle spasms.   vitamin B-12 1000 MCG tablet Commonly known as: CYANOCOBALAMIN Take 1,000 mcg by mouth daily.      Follow-up Information    Jerad Dunlap, Forest Gleason, MD Follow up in 1 week(s).   Specialties: General Surgery, Radiology Why: Call Monday AM for time:  [phone] Contact information: [address] [place] [address] [phone]           Signed: Robert Bellow 08/03/2019, 10:10 AM

## 2019-12-24 ENCOUNTER — Ambulatory Visit: Payer: 59 | Admitting: Dermatology

## 2019-12-24 ENCOUNTER — Other Ambulatory Visit: Payer: Self-pay

## 2019-12-24 ENCOUNTER — Encounter: Payer: Self-pay | Admitting: Dermatology

## 2019-12-24 DIAGNOSIS — R238 Other skin changes: Secondary | ICD-10-CM

## 2019-12-24 DIAGNOSIS — D229 Melanocytic nevi, unspecified: Secondary | ICD-10-CM

## 2019-12-24 DIAGNOSIS — D1800 Hemangioma unspecified site: Secondary | ICD-10-CM

## 2019-12-24 DIAGNOSIS — D2239 Melanocytic nevi of other parts of face: Secondary | ICD-10-CM

## 2019-12-24 DIAGNOSIS — Z1283 Encounter for screening for malignant neoplasm of skin: Secondary | ICD-10-CM | POA: Diagnosis not present

## 2019-12-24 DIAGNOSIS — D485 Neoplasm of uncertain behavior of skin: Secondary | ICD-10-CM | POA: Diagnosis not present

## 2019-12-24 DIAGNOSIS — L918 Other hypertrophic disorders of the skin: Secondary | ICD-10-CM

## 2019-12-24 DIAGNOSIS — L578 Other skin changes due to chronic exposure to nonionizing radiation: Secondary | ICD-10-CM

## 2019-12-24 DIAGNOSIS — L821 Other seborrheic keratosis: Secondary | ICD-10-CM

## 2019-12-24 DIAGNOSIS — L820 Inflamed seborrheic keratosis: Secondary | ICD-10-CM | POA: Diagnosis not present

## 2019-12-24 DIAGNOSIS — L814 Other melanin hyperpigmentation: Secondary | ICD-10-CM

## 2019-12-24 DIAGNOSIS — C4491 Basal cell carcinoma of skin, unspecified: Secondary | ICD-10-CM

## 2019-12-24 HISTORY — DX: Basal cell carcinoma of skin, unspecified: C44.91

## 2019-12-24 NOTE — Progress Notes (Deleted)
Follow-Up Visit   Subjective  Derek Torres is a 54 y.o. male who presents for the following: Annual Exam (UBSE - patient has noticed lesions on his face, scalp, and trunk that he would like checked). The patient presents for Upper Body Skin Exam (UBSE) for skin cancer screening and mole check.  The following portions of the chart were reviewed this encounter and updated as appropriate:  Tobacco  Allergies  Meds  Problems  Med Hx  Surg Hx  Fam Hx     Review of Systems:  No other skin or systemic complaints except as noted in HPI or Assessment and Plan.  Objective  Well appearing patient in no apparent distress; mood and affect are within normal limits.  All skin waist up examined.  Objective  R lat deltoid: 1.1 cm pink patch   Objective  R sup med scapula: 0.6 cm irregular brown macule   Objective  R scalp supra auricular x 1, back, L arm ,L areola x 17 (16): Erythematous keratotic or waxy stuck-on papule or plaque.   Objective  R nose: Granulomatous papule.  Images    Assessment & Plan  Neoplasm of uncertain behavior of skin (2) R lat deltoid  Skin / nail biopsy Type of biopsy: tangential   Informed consent: discussed and consent obtained   Timeout: patient name, date of birth, surgical site, and procedure verified   Procedure prep:  Patient was prepped and draped in usual sterile fashion Prep type:  Isopropyl alcohol Anesthesia: the lesion was anesthetized in a standard fashion   Anesthetic:  1% lidocaine w/ epinephrine 1-100,000 buffered w/ 8.4% NaHCO3 Instrument used: flexible razor blade   Hemostasis achieved with: pressure, aluminum chloride and electrodesiccation   Outcome: patient tolerated procedure well   Post-procedure details: sterile dressing applied and wound care instructions given   Dressing type: bandage and petrolatum    Specimen 1 - Surgical pathology Differential Diagnosis: D48.5 r/o BCC  Check Margins: No 1.1 cm pink  patch  R sup med scapula  Epidermal / dermal shaving  Lesion diameter (cm):  0.6 Informed consent: discussed and consent obtained   Timeout: patient name, date of birth, surgical site, and procedure verified   Procedure prep:  Patient was prepped and draped in usual sterile fashion Prep type:  Isopropyl alcohol Anesthesia: the lesion was anesthetized in a standard fashion   Anesthetic:  1% lidocaine w/ epinephrine 1-100,000 buffered w/ 8.4% NaHCO3 Instrument used: flexible razor blade   Hemostasis achieved with: pressure, aluminum chloride and electrodesiccation   Outcome: patient tolerated procedure well   Post-procedure details: sterile dressing applied and wound care instructions given   Dressing type: bandage and petrolatum    Specimen 2 - Surgical pathology Differential Diagnosis: D48.5 r/o dysplastic nevus  Check Margins: No 0.6 cm irregular brown macule  Inflamed seborrheic keratosis (16) R scalp supra auricular x 1, back, L arm ,L areola x 17  Papule R nose Benign appearing, observe Recheck next visit.  If persistent, consider biopsy. Appears to be acne papule - possibly granulomatous?  Skin cancer screening   Lentigines - Scattered tan macules - Discussed due to sun exposure - Benign, observe - Call for any changes  Seborrheic Keratoses - Stuck-on, waxy, tan-brown papules and plaques  - Discussed benign etiology and prognosis. - Observe - Call for any changes  Melanocytic Nevi - Tan-brown and/or pink-flesh-colored symmetric macules and papules - Benign appearing on exam today - Observation - Call clinic for new or changing moles -  Recommend daily use of broad spectrum spf 30+ sunscreen to sun-exposed areas.   Hemangiomas - Red papules - Discussed benign nature - Observe - Call for any changes  Actinic Damage - diffuse scaly erythematous macules with underlying dyspigmentation - Recommend daily broad spectrum sunscreen SPF 30+ to sun-exposed  areas, reapply every 2 hours as needed.  - Call for new or changing lesions.  Acrochordons (Skin Tags) - Fleshy, skin-colored pedunculated papules - Benign appearing.  - Observe. - If desired, they can be removed with an in office procedure that is not covered by insurance. - Please call the clinic if you notice any new or changing lesions.  Skin cancer screening performed today.  Return in about 3 months (around 03/25/2020) for follow up - recheck ISK's and bx site.  Luther Redo, CMA, am acting as scribe for Sarina Ser, MD .  Documentation: I have reviewed the above documentation for accuracy and completeness, and I agree with the above.  Sarina Ser, MD

## 2019-12-28 NOTE — Patient Instructions (Signed)

## 2019-12-28 NOTE — Progress Notes (Signed)
Follow-Up Visit   Subjective  Derek Torres is a 54 y.o. male who presents for the following: Annual Exam (UBSE - patient has noticed lesions on his face, scalp, and trunk that he would like checked). The patient presents for Upper Body Skin Exam (UBSE) for skin cancer screening and mole check.  The following portions of the chart were reviewed this encounter and updated as appropriate:  Tobacco  Allergies  Meds  Problems  Med Hx  Surg Hx  Fam Hx     Review of Systems:  No other skin or systemic complaints except as noted in HPI or Assessment and Plan.  Objective  Well appearing patient in no apparent distress; mood and affect are within normal limits.  All skin waist up examined.  Objective  R lat deltoid: 1.1 cm pink patch   Objective  R sup med scapula: 0.6 cm irregular brown macule   Objective  R scalp supra auricular x 1, back, L arm ,L areola x 17 (16): Erythematous keratotic or waxy stuck-on papule or plaque.   Objective  R nose: Granulomatous papule.  Images     Assessment & Plan  Neoplasm of uncertain behavior of skin (2) R lat deltoid  Skin / nail biopsy Type of biopsy: tangential   Informed consent: discussed and consent obtained   Timeout: patient name, date of birth, surgical site, and procedure verified   Procedure prep:  Patient was prepped and draped in usual sterile fashion Prep type:  Isopropyl alcohol Anesthesia: the lesion was anesthetized in a standard fashion   Anesthetic:  1% lidocaine w/ epinephrine 1-100,000 buffered w/ 8.4% NaHCO3 Instrument used: flexible razor blade   Hemostasis achieved with: pressure, aluminum chloride and electrodesiccation   Outcome: patient tolerated procedure well   Post-procedure details: sterile dressing applied and wound care instructions given   Dressing type: bandage and petrolatum    Specimen 1 - Surgical pathology Differential Diagnosis: D48.5 r/o BCC  Check Margins: No 1.1 cm pink  patch  R sup med scapula  Epidermal / dermal shaving  Lesion diameter (cm):  0.6 Informed consent: discussed and consent obtained   Timeout: patient name, date of birth, surgical site, and procedure verified   Procedure prep:  Patient was prepped and draped in usual sterile fashion Prep type:  Isopropyl alcohol Anesthesia: the lesion was anesthetized in a standard fashion   Anesthetic:  1% lidocaine w/ epinephrine 1-100,000 buffered w/ 8.4% NaHCO3 Instrument used: flexible razor blade   Hemostasis achieved with: pressure, aluminum chloride and electrodesiccation   Outcome: patient tolerated procedure well   Post-procedure details: sterile dressing applied and wound care instructions given   Dressing type: bandage and petrolatum    Specimen 2 - Surgical pathology Differential Diagnosis: D48.5 r/o dysplastic nevus  Check Margins: No 0.6 cm irregular brown macule  Inflamed seborrheic keratosis (16) R scalp supra auricular x 1, back, L arm ,L areola x 17  Destruction of lesion - R scalp supra auricular x 1, back, L arm ,L areola x 17 Complexity: simple   Destruction method: cryotherapy   Informed consent: discussed and consent obtained   Timeout:  patient name, date of birth, surgical site, and procedure verified Lesion destroyed using liquid nitrogen: Yes   Region frozen until ice ball extended beyond lesion: Yes   Outcome: patient tolerated procedure well with no complications   Post-procedure details: wound care instructions given    Papule R nose Appears to be granulomatous acne/rosacea papule, but if persistent, will  need to biopsy. Benign appearing, observe  Skin cancer screening  Lentigines - Scattered tan macules - Discussed due to sun exposure - Benign, observe - Call for any changes  Seborrheic Keratoses - Stuck-on, waxy, tan-brown papules and plaques  - Discussed benign etiology and prognosis. - Observe - Call for any changes  Melanocytic Nevi -  Tan-brown and/or pink-flesh-colored symmetric macules and papules - Benign appearing on exam today - Observation - Call clinic for new or changing moles - Recommend daily use of broad spectrum spf 30+ sunscreen to sun-exposed areas.   Hemangiomas - Red papules - Discussed benign nature - Observe - Call for any changes  Actinic Damage - diffuse scaly erythematous macules with underlying dyspigmentation - Recommend daily broad spectrum sunscreen SPF 30+ to sun-exposed areas, reapply every 2 hours as needed.  - Call for new or changing lesions.  Acrochordons (Skin Tags) - Fleshy, skin-colored pedunculated papules - Benign appearing.  - Observe. - If desired, they can be removed with an in office procedure that is not covered by insurance. - Please call the clinic if you notice any new or changing lesions.  Return in about 3 months (around 03/25/2020) for follow up - recheck ISK's and bx site.  Luther Redo, CMA, am acting as scribe for Sarina Ser, MD .  Documentation: I have reviewed the above documentation for accuracy and completeness, and I agree with the above.  Sarina Ser, MD

## 2019-12-30 ENCOUNTER — Telehealth: Payer: Self-pay

## 2019-12-30 NOTE — Telephone Encounter (Signed)
-----   Message from Ralene Bathe, MD sent at 12/29/2019  6:41 PM EDT ----- 1. Skin , right lateral deltoid SUPERFICIAL BASAL CELL CARCINOMA 2. 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

## 2019-12-30 NOTE — Telephone Encounter (Signed)
Left message on voicemail to return my call.  

## 2019-12-31 NOTE — Telephone Encounter (Signed)
Patient advised of BX results and will do EDC at January follow up.

## 2020-03-30 ENCOUNTER — Encounter: Payer: Self-pay | Admitting: Dermatology

## 2020-03-30 ENCOUNTER — Other Ambulatory Visit: Payer: Self-pay

## 2020-03-30 ENCOUNTER — Ambulatory Visit: Payer: 59 | Admitting: Dermatology

## 2020-03-30 DIAGNOSIS — C44519 Basal cell carcinoma of skin of other part of trunk: Secondary | ICD-10-CM

## 2020-03-30 DIAGNOSIS — C44612 Basal cell carcinoma of skin of right upper limb, including shoulder: Secondary | ICD-10-CM

## 2020-03-30 DIAGNOSIS — L578 Other skin changes due to chronic exposure to nonionizing radiation: Secondary | ICD-10-CM

## 2020-03-30 DIAGNOSIS — C4491 Basal cell carcinoma of skin, unspecified: Secondary | ICD-10-CM

## 2020-03-30 HISTORY — DX: Basal cell carcinoma of skin, unspecified: C44.91

## 2020-03-30 NOTE — Progress Notes (Signed)
   Follow-Up Visit   Subjective  Derek Torres is a 55 y.o. male who presents for the following: Follow-up (Pt here to have biopsy proven BCC's treated on the R deltoid, R sup med scapula ).  The following portions of the chart were reviewed this encounter and updated as appropriate:   Tobacco  Allergies  Meds  Problems  Med Hx  Surg Hx  Fam Hx     Review of Systems:  No other skin or systemic complaints except as noted in HPI or Assessment and Plan.  Objective  Well appearing patient in no apparent distress; mood and affect are within normal limits.  A focused examination was performed including back, R arm . Relevant physical exam findings are noted in the Assessment and Plan.  Objective  Right deltoid: Pink pearly papule or plaque with arborizing vessels.   Objective  Right sup med scapula: Pink pearly papule or plaque with arborizing vessels.    Assessment & Plan  Basal cell carcinoma (BCC) of skin of right upper extremity including shoulder Right deltoid  Destruction of lesion Complexity: extensive   Destruction method: electrodesiccation and curettage   Informed consent: discussed and consent obtained   Timeout:  patient name, date of birth, surgical site, and procedure verified Procedure prep:  Patient was prepped and draped in usual sterile fashion Prep type:  Isopropyl alcohol Anesthesia: the lesion was anesthetized in a standard fashion   Anesthetic:  1% lidocaine w/ epinephrine 1-100,000 buffered w/ 8.4% NaHCO3 Curettage performed in three different directions: Yes   Electrodesiccation performed over the curetted area: Yes   Final wound size (cm):  1.2 Hemostasis achieved with:  pressure, aluminum chloride and electrodesiccation Outcome: patient tolerated procedure well with no complications   Post-procedure details: sterile dressing applied and wound care instructions given   Dressing type: bandage and petrolatum    Basal cell carcinoma (BCC) of  skin of other part of torso Right sup med scapula  Destruction of lesion Complexity: extensive   Destruction method: electrodesiccation and curettage   Informed consent: discussed and consent obtained   Timeout:  patient name, date of birth, surgical site, and procedure verified Procedure prep:  Patient was prepped and draped in usual sterile fashion Prep type:  Isopropyl alcohol Anesthesia: the lesion was anesthetized in a standard fashion   Anesthetic:  1% lidocaine w/ epinephrine 1-100,000 buffered w/ 8.4% NaHCO3 Curettage performed in three different directions: Yes   Electrodesiccation performed over the curetted area: Yes   Final wound size (cm):  1.6 Hemostasis achieved with:  pressure, aluminum chloride and electrodesiccation Outcome: patient tolerated procedure well with no complications   Post-procedure details: sterile dressing applied and wound care instructions given   Dressing type: bandage and petrolatum    Actinic Damage - chronic, secondary to cumulative UV radiation exposure/sun exposure over time - diffuse scaly erythematous macules with underlying dyspigmentation - Recommend daily broad spectrum sunscreen SPF 30+ to sun-exposed areas, reapply every 2 hours as needed.  - Call for new or changing lesions.  Return in about 6 months (around 09/27/2020) for UBSE.  IMarye Round, CMA, am acting as scribe for Sarina Ser, MD .  Documentation: I have reviewed the above documentation for accuracy and completeness, and I agree with the above.  Sarina Ser, MD

## 2020-03-30 NOTE — Patient Instructions (Signed)

## 2020-04-04 ENCOUNTER — Encounter: Payer: Self-pay | Admitting: Dermatology

## 2020-08-19 ENCOUNTER — Ambulatory Visit: Payer: 59 | Admitting: Dermatology

## 2020-08-19 ENCOUNTER — Other Ambulatory Visit: Payer: Self-pay

## 2020-08-19 DIAGNOSIS — Z85828 Personal history of other malignant neoplasm of skin: Secondary | ICD-10-CM | POA: Diagnosis not present

## 2020-08-19 DIAGNOSIS — D1800 Hemangioma unspecified site: Secondary | ICD-10-CM

## 2020-08-19 DIAGNOSIS — D485 Neoplasm of uncertain behavior of skin: Secondary | ICD-10-CM

## 2020-08-19 DIAGNOSIS — L821 Other seborrheic keratosis: Secondary | ICD-10-CM

## 2020-08-19 DIAGNOSIS — Z1283 Encounter for screening for malignant neoplasm of skin: Secondary | ICD-10-CM

## 2020-08-19 DIAGNOSIS — D2239 Melanocytic nevi of other parts of face: Secondary | ICD-10-CM

## 2020-08-19 DIAGNOSIS — C44519 Basal cell carcinoma of skin of other part of trunk: Secondary | ICD-10-CM | POA: Diagnosis not present

## 2020-08-19 DIAGNOSIS — D229 Melanocytic nevi, unspecified: Secondary | ICD-10-CM

## 2020-08-19 DIAGNOSIS — L814 Other melanin hyperpigmentation: Secondary | ICD-10-CM

## 2020-08-19 DIAGNOSIS — L578 Other skin changes due to chronic exposure to nonionizing radiation: Secondary | ICD-10-CM | POA: Diagnosis not present

## 2020-08-19 NOTE — Patient Instructions (Signed)

## 2020-08-19 NOTE — Progress Notes (Signed)
Follow-Up Visit   Subjective  Derek Torres is a 55 y.o. male who presents for the following: UBSE (Patient here for upper body skin exam and skin cancer screening. Patient with hx of BCC. /).  The following portions of the chart were reviewed this encounter and updated as appropriate:   Tobacco  Allergies  Meds  Problems  Med Hx  Surg Hx  Fam Hx      Review of Systems:  No other skin or systemic complaints except as noted in HPI or Assessment and Plan.  Objective  Well appearing patient in no apparent distress; mood and affect are within normal limits.  All skin waist up examined.  Midline forehead/glabella Pink papule 0.6cm     R superior medial pectoral 0.8cm pink papule R/o BCC        Assessment & Plan  Nevus Midline forehead/glabella Benign-appearing.  Observation.  Call clinic for new or changing lesions.  Recommend daily use of broad spectrum spf 30+ sunscreen to sun-exposed areas.  Patient advises this mole has been present all his life with no change, if changes will biopsy.   Neoplasm of uncertain behavior of skin R superior medial pectoral Skin / nail biopsy Type of biopsy: tangential   Informed consent: discussed and consent obtained   Timeout: patient name, date of birth, surgical site, and procedure verified   Procedure prep:  Patient was prepped and draped in usual sterile fashion Prep type:  Isopropyl alcohol Anesthesia: the lesion was anesthetized in a standard fashion   Anesthetic:  1% lidocaine w/ epinephrine 1-100,000 buffered w/ 8.4% NaHCO3 Instrument used: flexible razor blade   Hemostasis achieved with: pressure, aluminum chloride and electrodesiccation   Outcome: patient tolerated procedure well   Post-procedure details: sterile dressing applied and wound care instructions given   Dressing type: petrolatum and bandage    Specimen 1 - Surgical pathology Differential Diagnosis: R/o BCC  Check Margins: No 0.8cm pink  papule  Lentigines - Scattered tan macules - Due to sun exposure - Benign-appering, observe - Recommend daily broad spectrum sunscreen SPF 30+ to sun-exposed areas, reapply every 2 hours as needed. - Call for any changes  Seborrheic Keratoses - Stuck-on, waxy, tan-brown papules and/or plaques  - Benign-appearing - Discussed benign etiology and prognosis. - Observe - Call for any changes  Melanocytic Nevi - Tan-brown and/or pink-flesh-colored symmetric macules and papules - Benign appearing on exam today - Observation - Call clinic for new or changing moles - Recommend daily use of broad spectrum spf 30+ sunscreen to sun-exposed areas.   Hemangiomas - Red papules - Discussed benign nature - Observe - Call for any changes  Actinic Damage - Chronic condition, secondary to cumulative UV/sun exposure - diffuse scaly erythematous macules with underlying dyspigmentation - Recommend daily broad spectrum sunscreen SPF 30+ to sun-exposed areas, reapply every 2 hours as needed.  - Staying in the shade or wearing long sleeves, sun glasses (UVA+UVB protection) and wide brim hats (4-inch brim around the entire circumference of the hat) are also recommended for sun protection.  - Call for new or changing lesions.  Skin cancer screening performed today.  History of Basal Cell Carcinoma of the Skin - No evidence of recurrence today - Recommend regular full body skin exams - Recommend daily broad spectrum sunscreen SPF 30+ to sun-exposed areas, reapply every 2 hours as needed.  - Call if any new or changing lesions are noted between office visits  Return in about 6 months (around 02/18/2021) for  TBSE.  Graciella Belton, RMA, am acting as scribe for Sarina Ser, MD . Documentation: I have reviewed the above documentation for accuracy and completeness, and I agree with the above.  Sarina Ser, MD

## 2020-08-23 ENCOUNTER — Encounter: Payer: Self-pay | Admitting: Dermatology

## 2020-08-25 ENCOUNTER — Ambulatory Visit: Payer: 59 | Admitting: Dermatology

## 2020-09-01 ENCOUNTER — Telehealth: Payer: Self-pay

## 2020-09-01 NOTE — Telephone Encounter (Signed)
-----   Message from Ralene Bathe, MD sent at 08/29/2020  5:19 PM EDT ----- Diagnosis Skin , right superior medial pectoral BASAL CELL CARCINOMA, NODULAR PATTERN, BASE INVOLVED  Cancer - BCC Schedule surgery

## 2020-09-01 NOTE — Telephone Encounter (Signed)
Left message on voicemail to return my call.  

## 2020-09-21 ENCOUNTER — Ambulatory Visit: Payer: 59 | Admitting: Dermatology

## 2020-09-22 ENCOUNTER — Telehealth: Payer: Self-pay

## 2020-09-22 NOTE — Telephone Encounter (Signed)
Advised patient of results and scheduled for surgery/hd

## 2020-09-22 NOTE — Telephone Encounter (Signed)
-----   Message from Ralene Bathe, MD sent at 08/29/2020  5:19 PM EDT ----- Diagnosis Skin , right superior medial pectoral BASAL CELL CARCINOMA, NODULAR PATTERN, BASE INVOLVED  Cancer - BCC Schedule surgery

## 2020-11-01 ENCOUNTER — Ambulatory Visit: Payer: 59 | Admitting: Dermatology

## 2020-11-01 ENCOUNTER — Encounter: Payer: Self-pay | Admitting: Dermatology

## 2020-11-01 ENCOUNTER — Other Ambulatory Visit: Payer: Self-pay

## 2020-11-01 DIAGNOSIS — L820 Inflamed seborrheic keratosis: Secondary | ICD-10-CM

## 2020-11-01 DIAGNOSIS — C44519 Basal cell carcinoma of skin of other part of trunk: Secondary | ICD-10-CM | POA: Diagnosis not present

## 2020-11-01 DIAGNOSIS — L578 Other skin changes due to chronic exposure to nonionizing radiation: Secondary | ICD-10-CM

## 2020-11-01 MED ORDER — MUPIROCIN 2 % EX OINT: 1.0000 "application " | TOPICAL_OINTMENT | Freq: Every day | CUTANEOUS | 0 refills | Status: AC

## 2020-11-01 NOTE — Progress Notes (Signed)
Follow-Up Visit   Subjective  Derek Torres is a 55 y.o. male who presents for the following: Basal Cell Carcinoma (Biopsy proven of right sup med pectoral - Excise today). He also has spots on his legs that are irritating and would like treated today.  He also wonders about brown spots on his face and would like to consider treatment.  The following portions of the chart were reviewed this encounter and updated as appropriate:   Tobacco  Allergies  Meds  Problems  Med Hx  Surg Hx  Fam Hx     Review of Systems:  No other skin or systemic complaints except as noted in HPI or Assessment and Plan.  Objective  Well appearing patient in no apparent distress; mood and affect are within normal limits.  A focused examination was performed including chest. Relevant physical exam findings are noted in the Assessment and Plan.  Right sup medial pectoral 1.5 x 0.8 cm Healing biopsy site  Right calf x 3 (3) Erythematous keratotic or waxy stuck-on papule or plaque.   Face Actinic changes and light brown macules   Assessment & Plan  Basal cell carcinoma (BCC) of skin of other part of torso Rigth sup medial pectoral  Skin excision  Lesion length (cm):  1.5 Lesion width (cm):  0.8 Margin per side (cm):  0.2 Total excision diameter (cm):  1.9 Informed consent: discussed and consent obtained   Timeout: patient name, date of birth, surgical site, and procedure verified   Procedure prep:  Patient was prepped and draped in usual sterile fashion Prep type:  Isopropyl alcohol and povidone-iodine Anesthesia: the lesion was anesthetized in a standard fashion   Anesthetic:  1% lidocaine w/ epinephrine 1-100,000 buffered w/ 8.4% NaHCO3 Instrument used: #15 blade   Hemostasis achieved with: pressure   Hemostasis achieved with comment:  Electrocautery Outcome: patient tolerated procedure well with no complications   Post-procedure details: sterile dressing applied and wound care  instructions given   Dressing type: bandage and pressure dressing (mupirocin)    Skin repair Complexity:  Complex Final length (cm):  3.5 Reason for type of repair: reduce tension to allow closure, reduce the risk of dehiscence, infection, and necrosis, reduce subcutaneous dead space and avoid a hematoma, allow closure of the large defect, preserve normal anatomy, preserve normal anatomical and functional relationships and enhance both functionality and cosmetic results   Undermining: area extensively undermined   Undermining comment:  Undermining defect 1.2 cm Subcutaneous layers (deep stitches):  Suture size:  3-0 Suture type: Vicryl (polyglactin 910)   Subcutaneous suture technique: inverted dermal. Fine/surface layer approximation (top stitches):  Suture size:  3-0 Suture type: nylon   Stitches: simple running   Suture removal (days):  7 Hemostasis achieved with: suture and pressure Outcome: patient tolerated procedure well with no complications   Post-procedure details: sterile dressing applied and wound care instructions given   Dressing type: bandage and pressure dressing (mupirocin)    mupirocin ointment (BACTROBAN) 2 % Apply 1 application topically daily. With dressing changes  Specimen 1 - Surgical pathology Differential Diagnosis: Biopsy proven BCC Check Margins: Yes [id number]  Inflamed seborrheic keratosis Right calf x 3  Will plan LN2 at post op appointment  Destruction of lesion - Left calf x 3 Complexity: simple   Destruction method: cryotherapy   Informed consent: discussed and consent obtained   Timeout:  patient name, date of birth, surgical site, and procedure verified Lesion destroyed using liquid nitrogen: Yes   Region frozen  until ice ball extended beyond lesion: Yes   Outcome: patient tolerated procedure well with no complications   Post-procedure details: wound care instructions given    Actinic skin damage Face Discussed laser; LN2 to spots;  and topical treatments  Will prescribe [facility] Anti-Aging Tretinoin 0.025%/Niacinamide/Vitamin C/Vitamin E/Turmeric/Resveratrol. Apply pea sized amount nightly to the entire face.  The patient was advised this is not covered by insurance since it is made by a compounding pharmacy. They will receive an email to check out and the medication will be mailed to their home.   Topical retinoid medications like tretinoin can cause dryness and irritation when first started. Only apply a pea-sized amount to the entire affected area. Avoid applying it around the eyes, edges of mouth and creases at the nose. If you experience irritation, use a good moisturizer first and/or apply the medicine less often. If you are doing well with the medicine, you can increase how often you use it until you are applying every night. Be careful with sun protection while using this medication as it can make you sensitive to the sun. This medicine should not be used by pregnant women.    Return in about 6 months (around 05/04/2021).  Patient's wife is a Marine scientist.  She will remove his sutures in 7 days from the chest and then apply Steri-Strips removal kit given today.  I, Ashok Cordia, CMA, am acting as scribe for Sarina Ser, MD . Documentation: I have reviewed the above documentation for accuracy and completeness, and I agree with the above.  Sarina Ser, MD

## 2020-11-01 NOTE — Patient Instructions (Addendum)
Instructions for [facility] Medications  One or more of your medications was sent to the [facility] mail order compounding pharmacy. You will receive an email from them and can purchase the medicine through that link. It will then be mailed to your home at the address you confirmed. If for any reason you do not receive an email from them, please check your spam folder. If you still do not find the email, please let us know. [facility] phone number is [phone].      Wound Care Instructions  Cleanse wound gently with soap and water once a day then pat dry with clean gauze. Apply a thing coat of Petrolatum (petroleum jelly, "Vaseline") over the wound (unless you have an allergy to this). We recommend that you use a new, sterile tube of Vaseline. Do not pick or remove scabs. Do not remove the yellow or white "healing tissue" from the base of the wound.  Cover the wound with fresh, clean, nonstick gauze and secure with paper tape. You may use Band-Aids in place of gauze and tape if the would is small enough, but would recommend trimming much of the tape off as there is often too much. Sometimes Band-Aids can irritate the skin.  You should call the office for your biopsy report after 1 week if you have not already been contacted.  If you experience any problems, such as abnormal amounts of bleeding, swelling, significant bruising, significant pain, or evidence of infection, please call the office immediately.  FOR ADULT SURGERY PATIENTS: If you need something for pain relief you may take 1 extra strength Tylenol (acetaminophen) AND 2 Ibuprofen ('200mg'$  each) together every 4 hours as needed for pain. (do not take these if you are allergic to them or if you have a reason you should not take them.) Typically, you may only need pain medication for 1 to 3 days.     If you have any questions or concerns for your doctor, please call our main line at [phone] and press option 4 to reach  your doctor's medical assistant. If no one answers, please leave a voicemail as directed and we will return your call as soon as possible. Messages left after 4 pm will be answered the following business day.   You may also send [place] a message via [place]. We typically respond to MyChart messages within 1-2 business days.  For prescription refills, please ask your pharmacy to contact our office. Our fax number is [phone].  If you have an urgent issue when the clinic is closed that cannot wait until the next business day, you can page your doctor at the number below.    Please note that while we do our best to be available for urgent issues outside of office hours, we are not available 24/7.   If you have an urgent issue and are unable to reach [place], you may choose to seek medical care at your doctor's office, retail clinic, urgent care center, or emergency room.  If you have a medical emergency, please immediately call 911 or go to the emergency department.  Pager Numbers  - Dr. Nehemiah Massed: [phone]  - Dr. Laurence Ferrari: [phone]  - Dr. Nicole Kindred: [phone]  In the event of inclement weather, please call our main line at [phone] for an update on the status of any delays or closures.  Dermatology Medication Tips: Please keep the boxes that topical medications come in in order to help keep track of the instructions about where and how  to use these. Pharmacies typically print the medication instructions only on the boxes and not directly on the medication tubes.   If your medication is too expensive, please contact our office at [phone] option 4 or send [place] a message through [place].   We are unable to tell what your co-pay for medications will be in advance as this is different depending on your insurance coverage. However, we may be able to find a substitute medication at lower cost or fill out paperwork to get insurance to cover a needed medication.   If a prior authorization  is required to get your medication covered by your insurance company, please allow [place] 1-2 business days to complete this process.  Drug prices often vary depending on where the prescription is filled and some pharmacies may offer cheaper prices.  The website www.goodrx.com contains coupons for medications through different pharmacies. The prices here do not account for what the cost may be with help from insurance (it may be cheaper with your insurance), but the website can give you the price if you did not use any insurance.  - You can print the associated coupon and take it with your prescription to the pharmacy.  - You may also stop by our office during regular business hours and pick up a GoodRx coupon card.  - If you need your prescription sent electronically to a different pharmacy, notify our office through [facility] or by phone at [phone] option 4.

## 2020-11-02 ENCOUNTER — Telehealth: Payer: Self-pay

## 2020-11-02 NOTE — Telephone Encounter (Signed)
Left message for patient regarding surgery/hd  

## 2020-11-09 ENCOUNTER — Telehealth: Payer: Self-pay

## 2020-11-09 NOTE — Telephone Encounter (Signed)
Advised patient of results. His wife removed his sutures and he seems to be healing well/hd

## 2020-11-09 NOTE — Telephone Encounter (Signed)
-----   Message from Ralene Bathe, MD sent at 11/08/2020  5:48 PM EDT ----- Diagnosis Skin (M), right sup medial pectoral RESIDUAL BASAL CELL CARCINOMA, MARGINS FREE  Cancer - BCC Margins free

## 2021-02-16 ENCOUNTER — Ambulatory Visit: Payer: 59 | Admitting: Dermatology

## 2021-04-27 IMAGING — CT CT ABD-PELV W/ CM
1 of 3 series · 14 of 32 positions shown, 19 images · IV contrast (APPLIED)
Comparison: None.

CLINICAL DATA: Acute onset right lower quadrant pain beginning this
morning. Nausea and vomiting. Leukocytosis.

EXAM:
CT ABDOMEN AND PELVIS WITH CONTRAST
TECHNIQUE: Multidetector CT imaging of the abdomen and pelvis was performed
using the standard protocol following bolus administration of
intravenous contrast.
CONTRAST:  100mL OMNIPAQUE IOHEXOL 300 MG/ML  SOLN

[Series 2: axial st · axial · 0.73mm/px · z∈[-982,-552]mm · 14 of 96 slices shown, 19 images]
[im 5/96  soft-tissue]
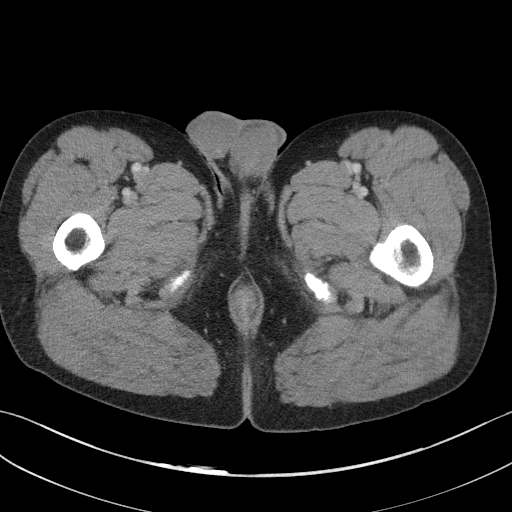
[im 5/96  bone]
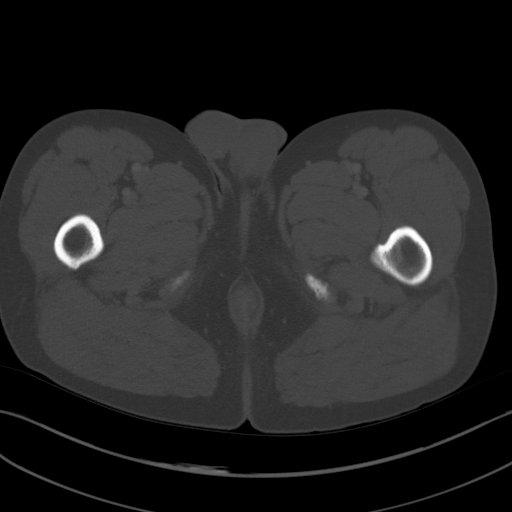
[im 15/96  soft-tissue]
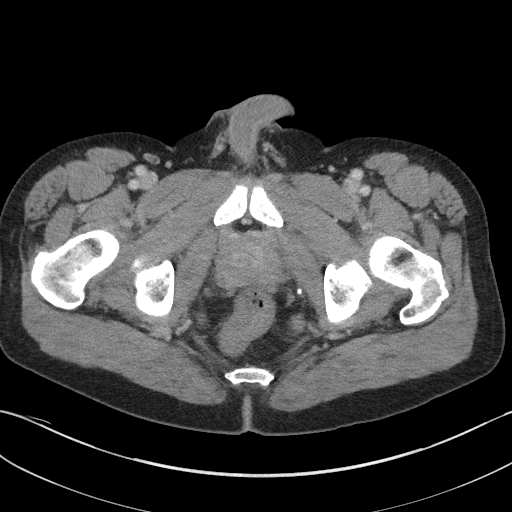
[im 20/96  soft-tissue]
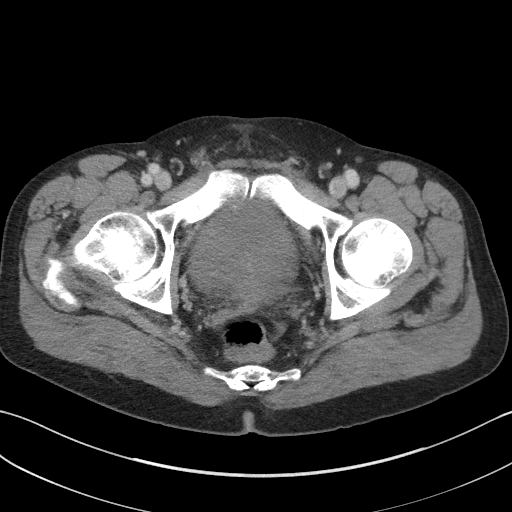
[im 29/96  soft-tissue]
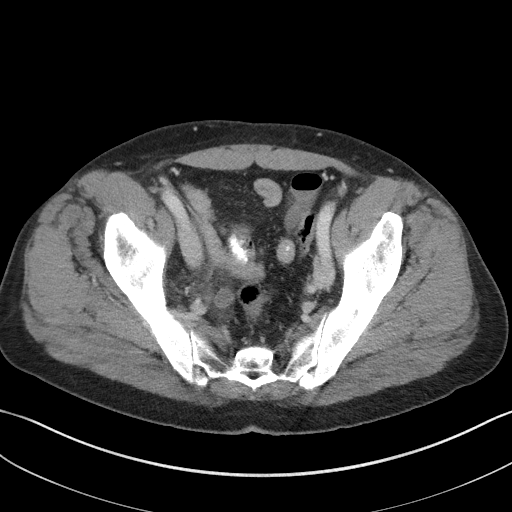
[im 34/96  soft-tissue]
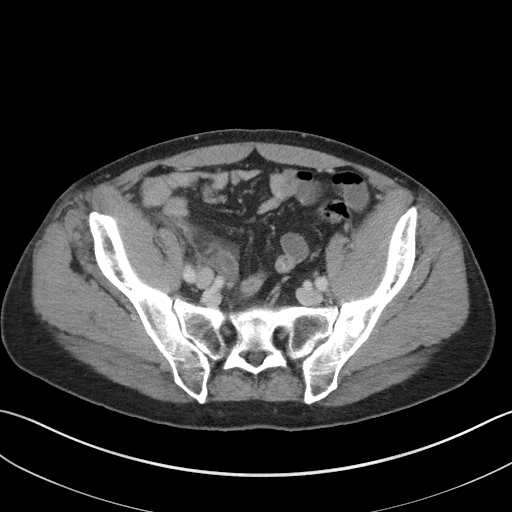
[im 43/96  soft-tissue]
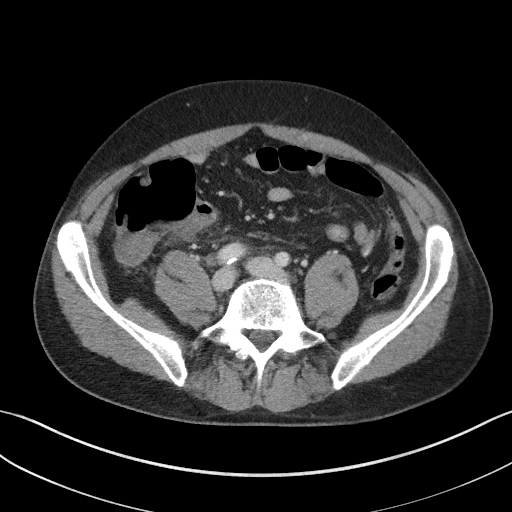
[im 48/96  soft-tissue]
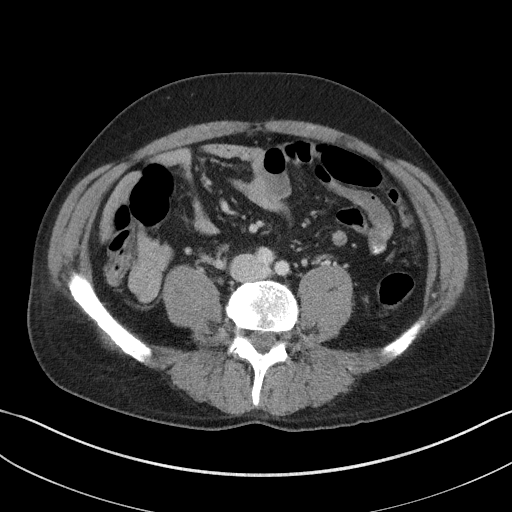
[im 53/96  soft-tissue]
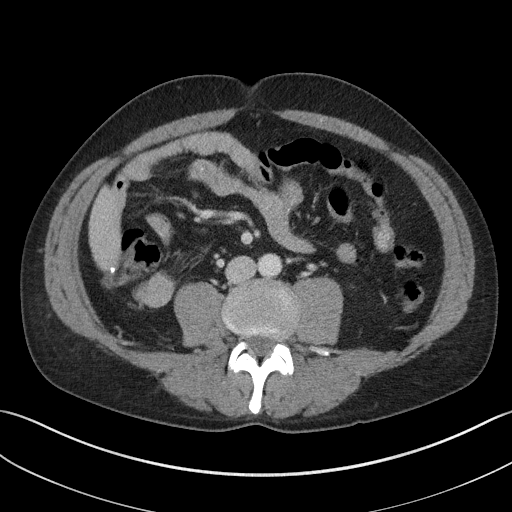
[im 62/96  soft-tissue]
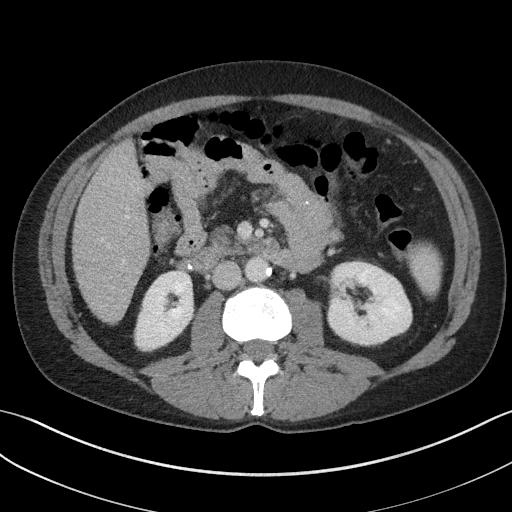
[im 62/96  bone]
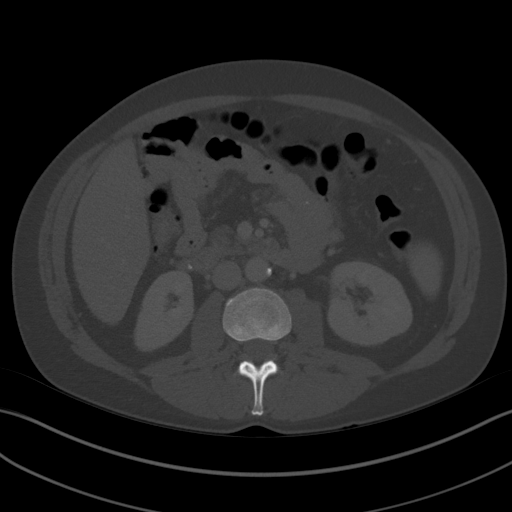
[im 67/96  soft-tissue]
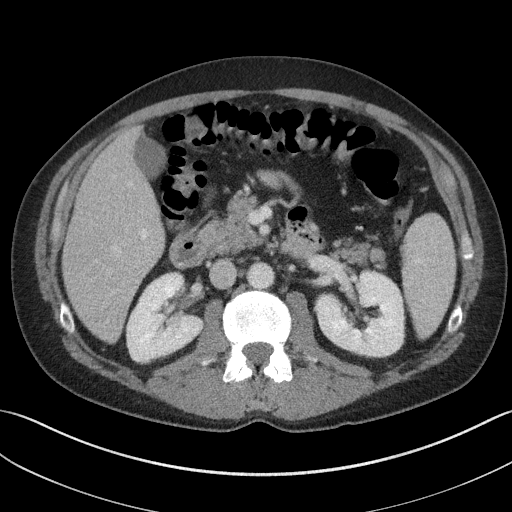
[im 77/96  soft-tissue]
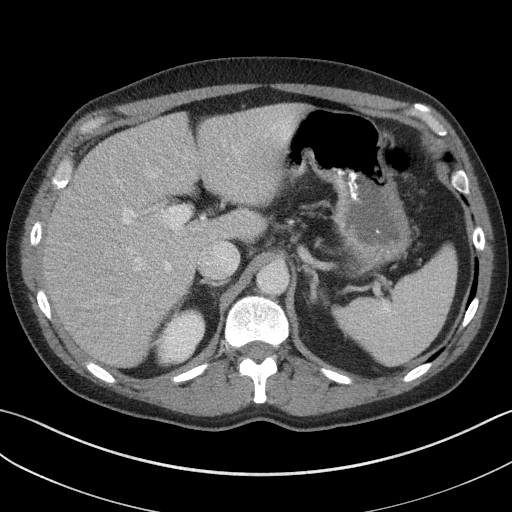
[im 77/96  lung]
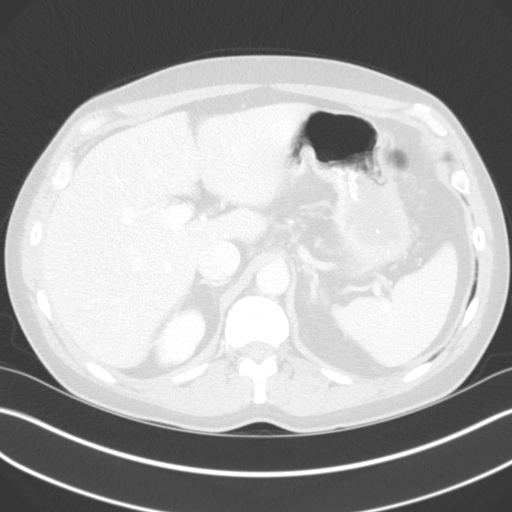
[im 81/96  soft-tissue]
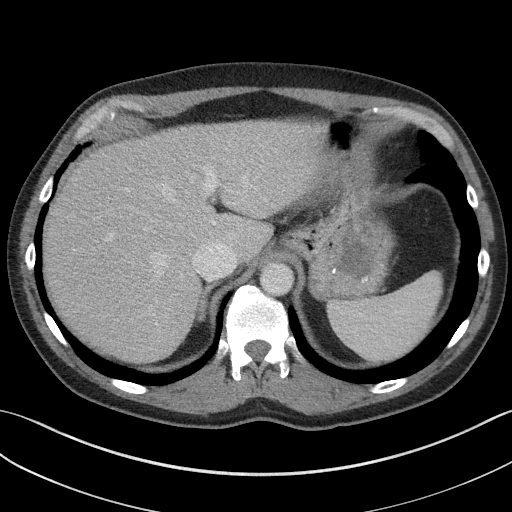
[im 81/96  lung]
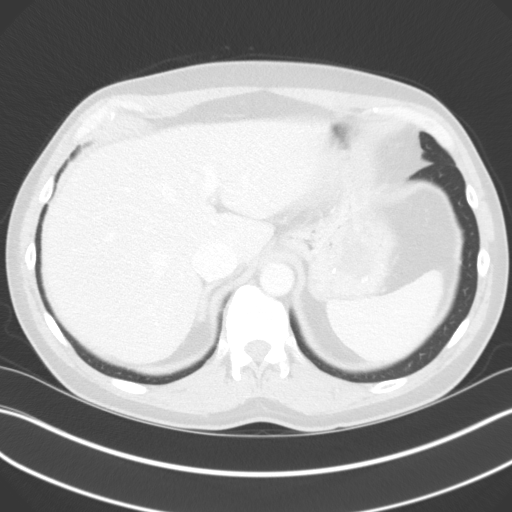
[im 86/96  lung]
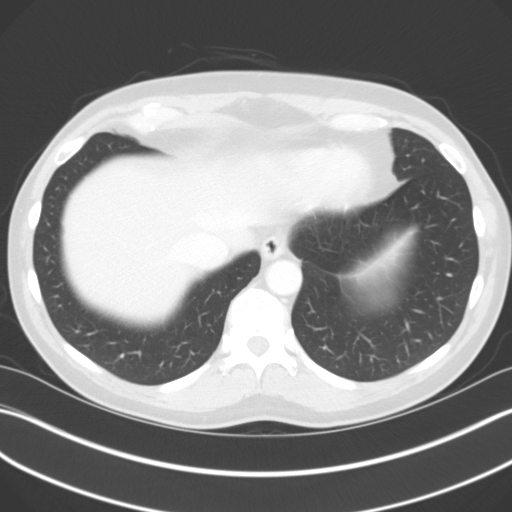
[im 91/96  soft-tissue]
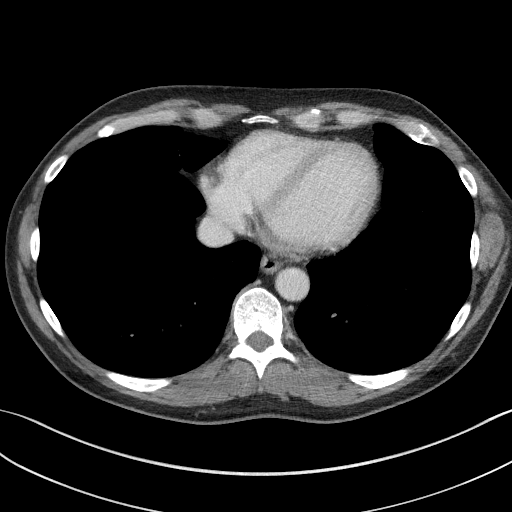
[im 91/96  lung]
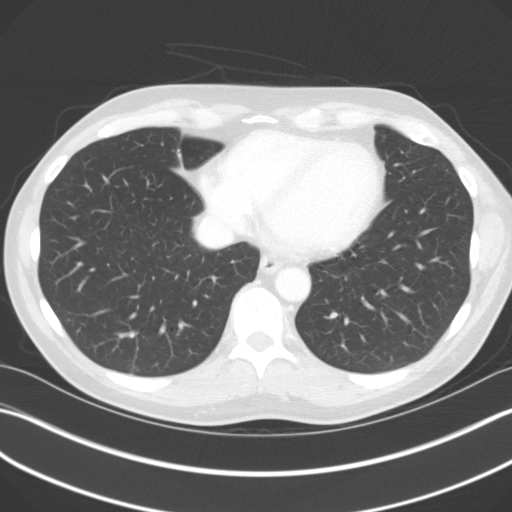

[14 of 32 positions shown; findings below may reference images not displayed]

FINDINGS: Lower Chest: No acute findings.

Hepatobiliary: No hepatic masses identified. Gallbladder is
unremarkable. No evidence of biliary ductal dilatation.

Pancreas:  No mass or inflammatory changes.

Spleen: Within normal limits in size and appearance.

Adrenals/Urinary Tract: No masses identified. A few tiny sub-cm
renal cysts are seen bilaterally. No evidence of ureteral calculi or
hydronephrosis.

Stomach/Bowel:  Findings of acute appendicitis are seen as follows:

Appendix: Location- Standard

Yiameter-XQ mm, with mild-to-moderate periappendiceal inflammatory
changes

Appendicolith- Absent

Mucosal hyper-enhancement- Present

Extraluminal Gas- Absent

Periappendiceal Collection- None

Vascular/Lymphatic: No pathologically enlarged lymph nodes. No
abdominal aortic aneurysm. Aortic atherosclerosis noted.

Reproductive:  No mass or other significant abnormality.

Other:  None.

Musculoskeletal:  No suspicious bone lesions identified.
IMPRESSION: Positive for acute appendicitis. No evidence of abscess or other
complication.

Aortic Atherosclerosis ([id number]).

These results will be called to the ordering clinician or
representative by the Radiologist Assistant, and communication
documented in the PACS or [REDACTED].

## 2021-05-01 IMAGING — DX DG ABDOMEN ACUTE W/ 1V CHEST
3 series · 3 of 3 positions shown · non-contrast
Comparison: None.

CLINICAL DATA: Abdominal pain, distention

EXAM:
DG ABDOMEN ACUTE W/ 1V CHEST

[abdomen erect]
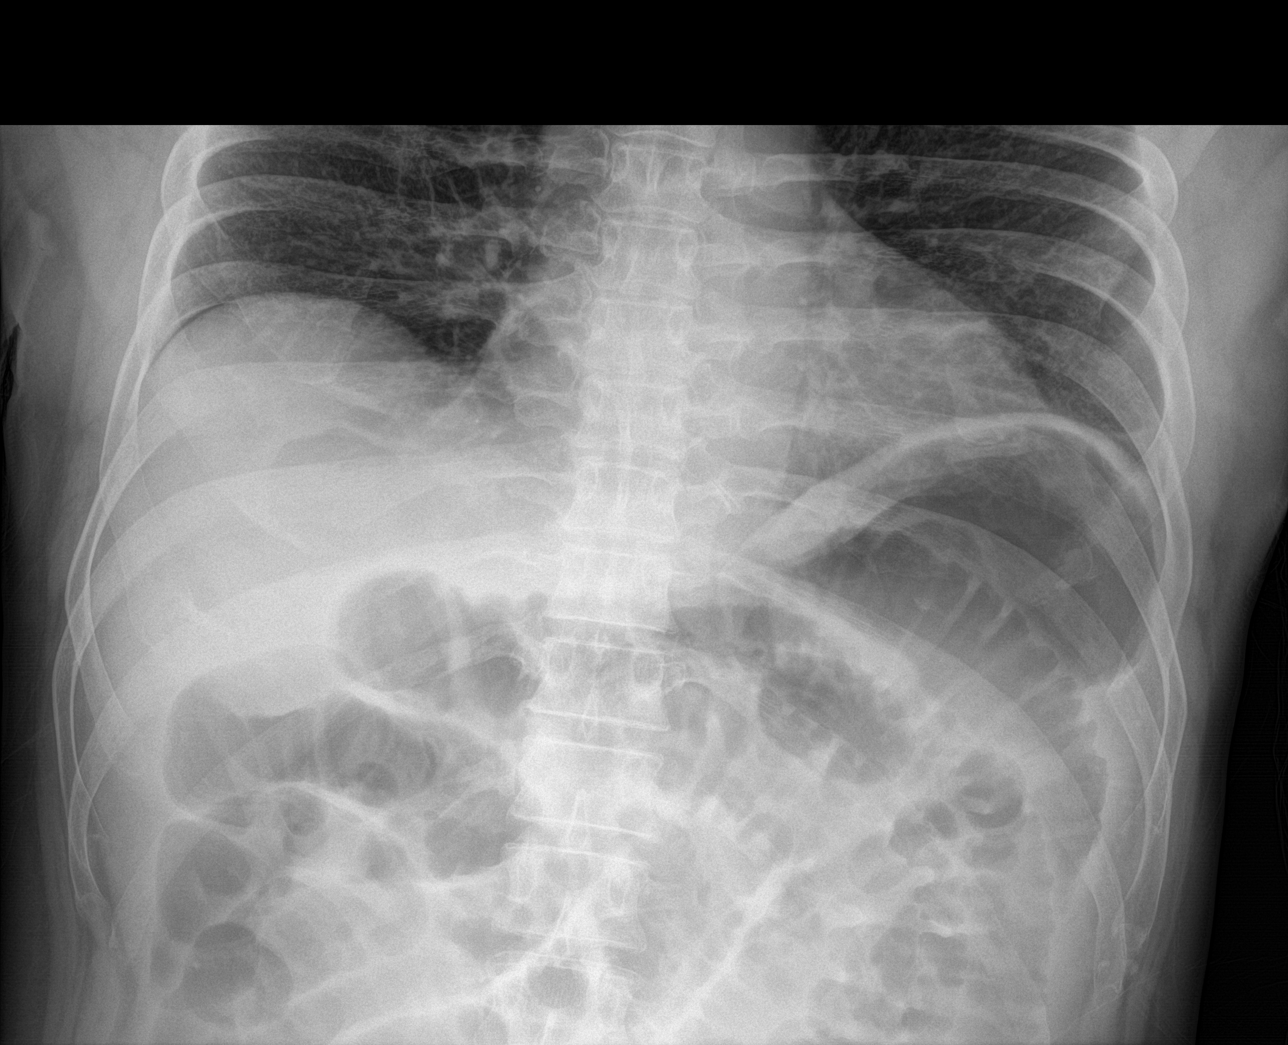

[abdomen supine]
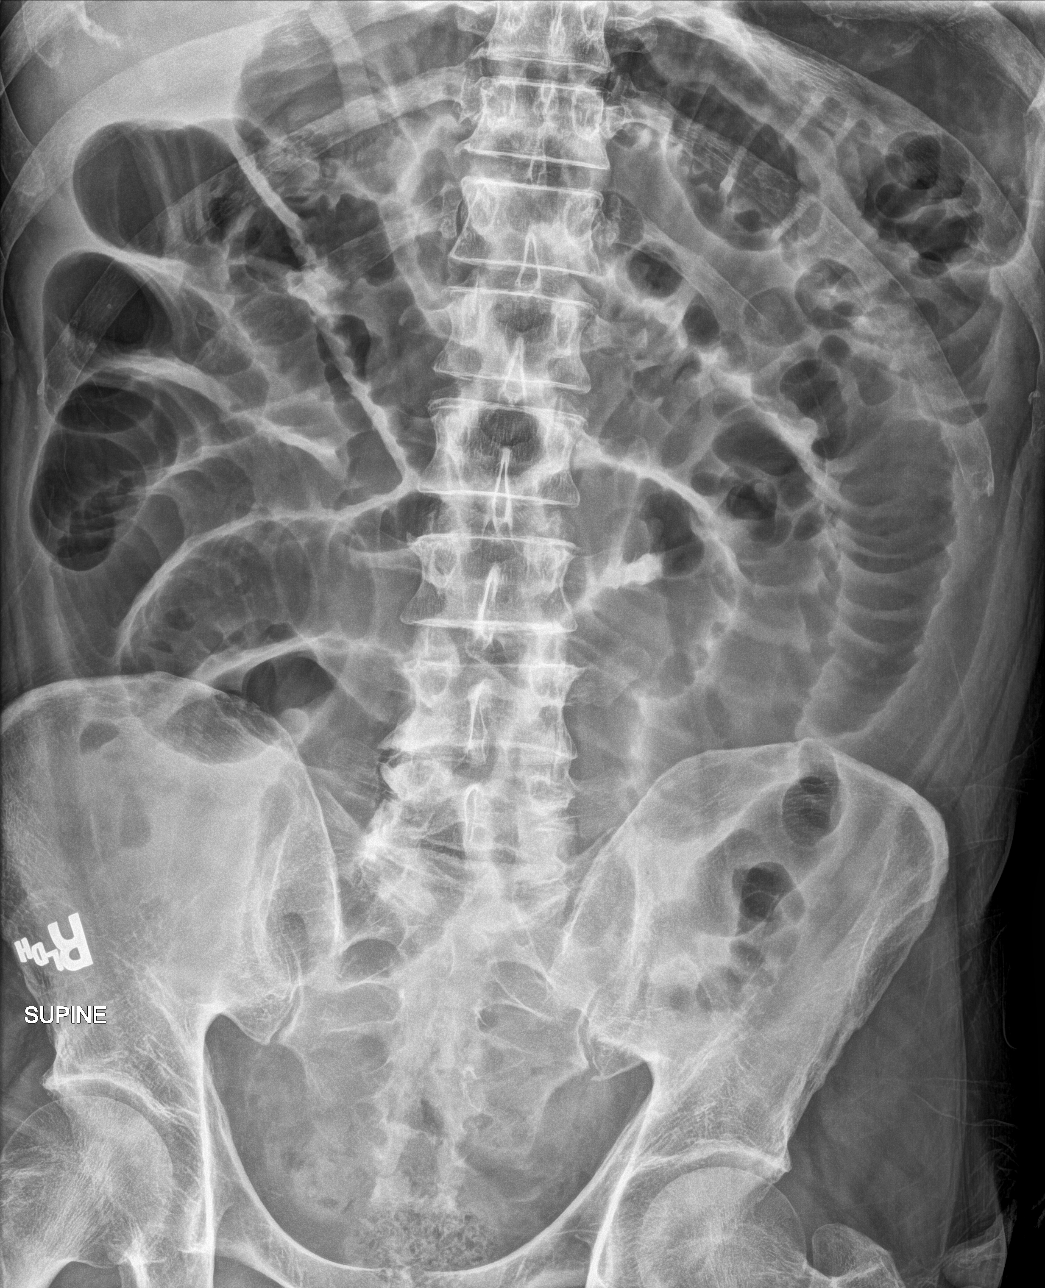

[chest ap]
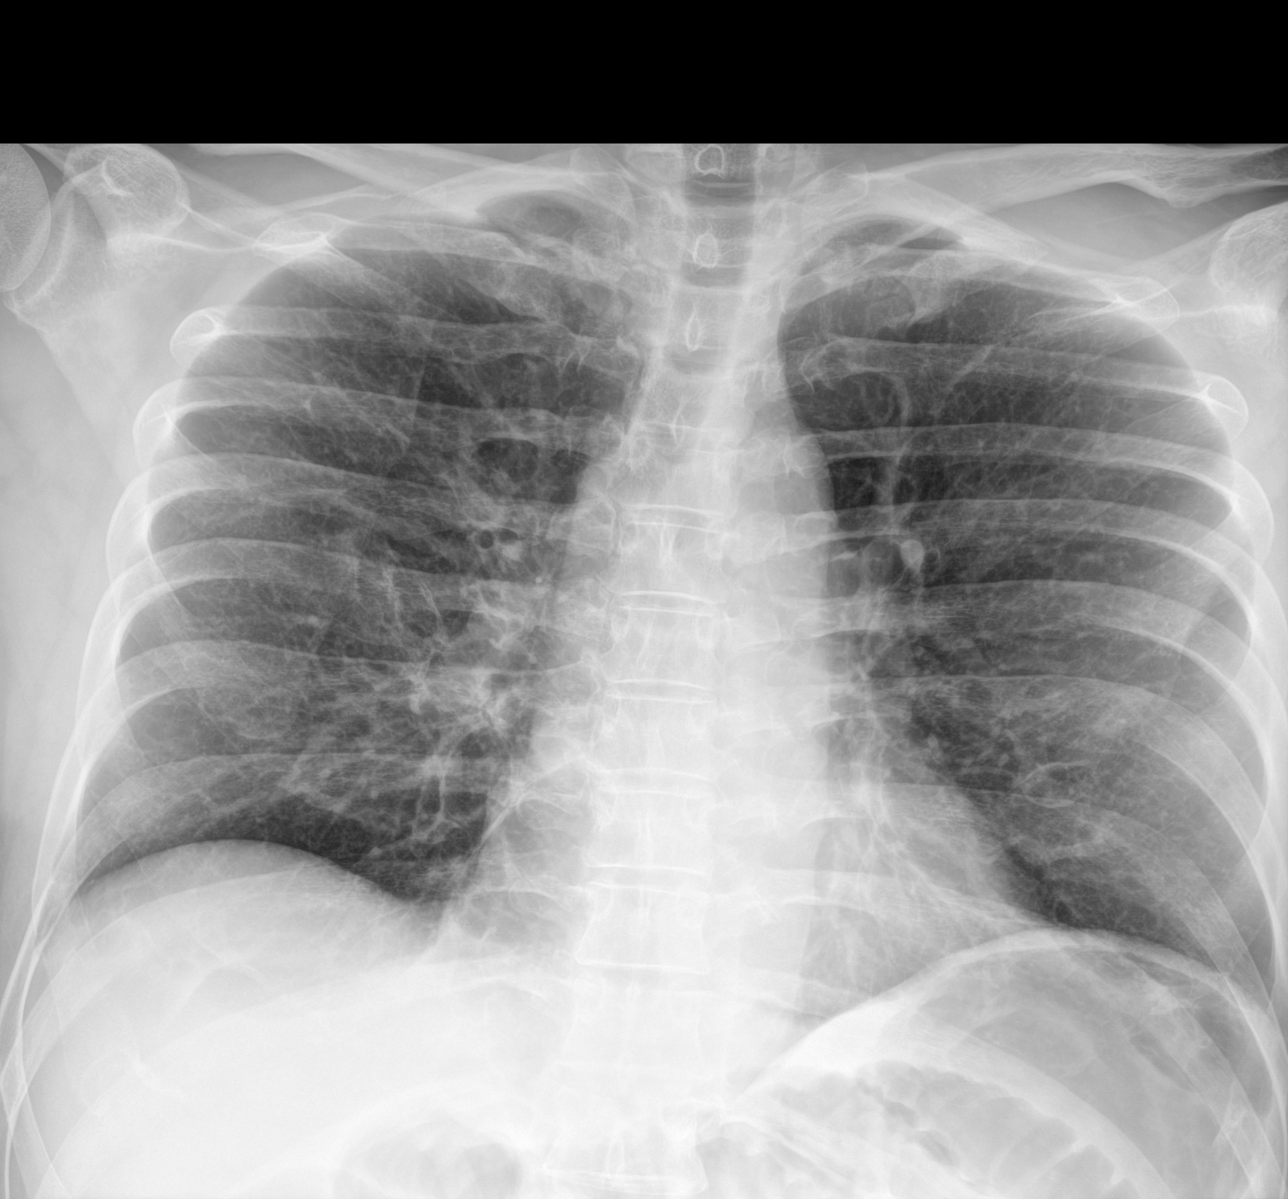

[3 of 3 positions shown; findings below may reference images not displayed]

FINDINGS: Gaseous distention of small bowel loops in the abdomen and pelvis
concerning for distal small bowel obstruction. Little colonic gas
seen. No organomegaly or free air.

No confluent airspace opacity or effusion.  Heart is normal size.

No acute bony abnormality.
IMPRESSION: Gaseous distention of small bowel in the abdomen and pelvis
concerning for distal small bowel obstruction

## 2021-05-02 IMAGING — CR DG ABDOMEN 2V
1 series · 3 of 3 positions shown · non-contrast
Comparison: 07/23/2019

CLINICAL DATA: Ileus

EXAM:
ABDOMEN - 2 VIEW

[Series 1: dg abd 2 views · 0.14mm/px · 3 of 3 slices shown]
[im 1/3]
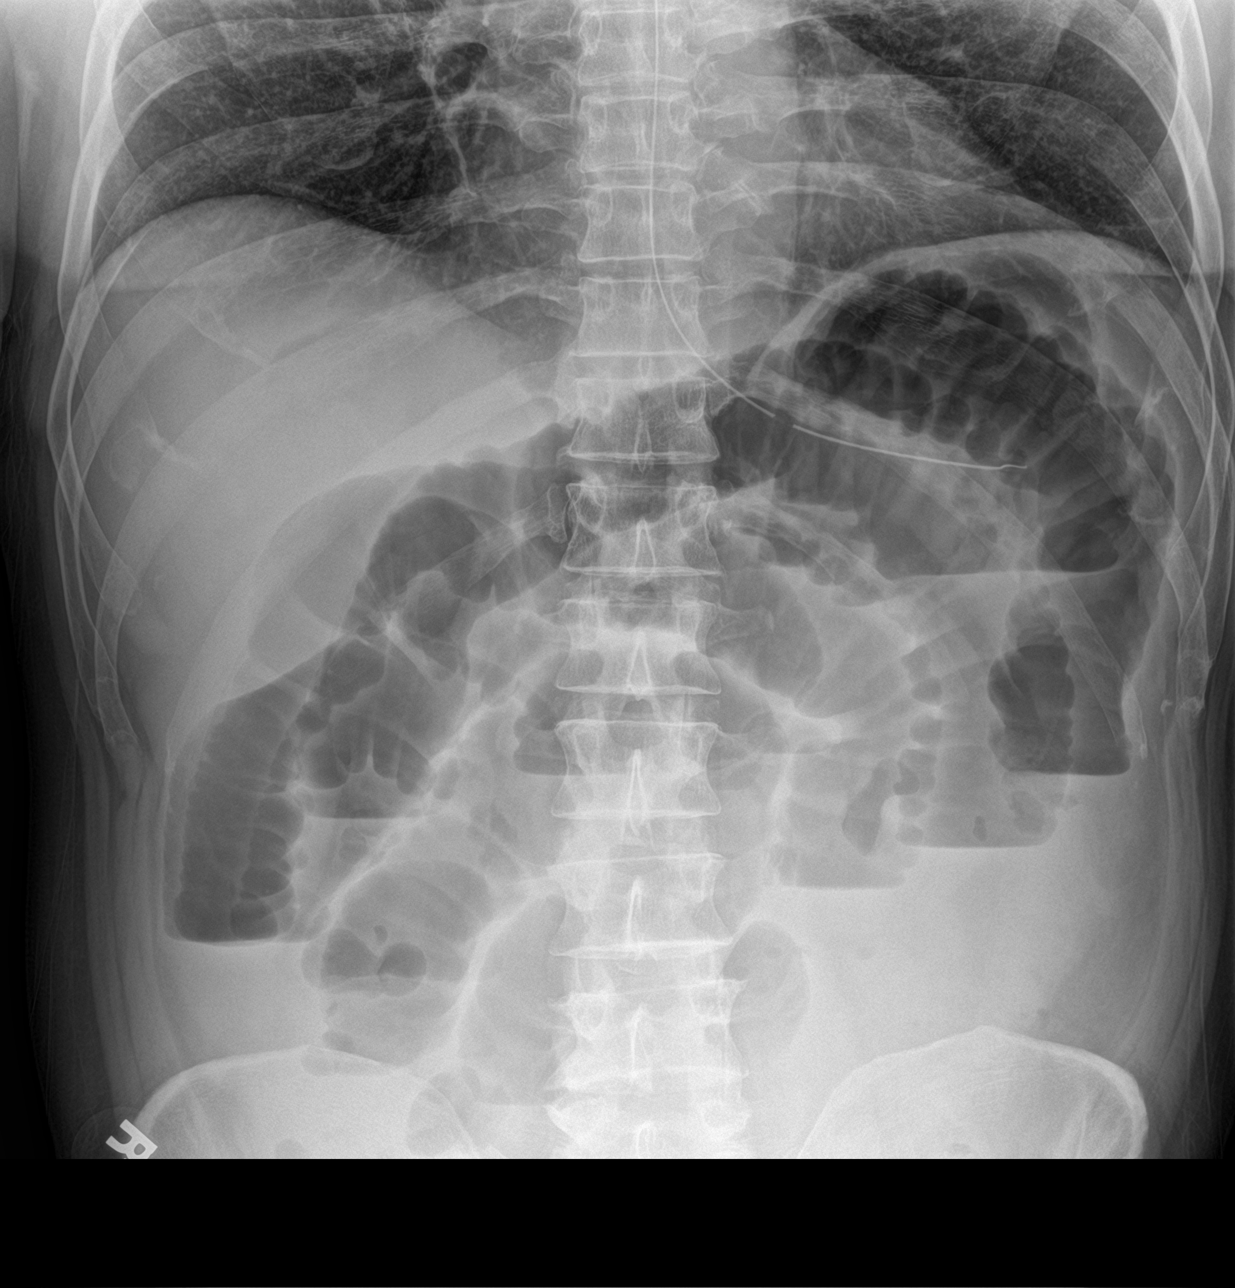
[im 2/3]
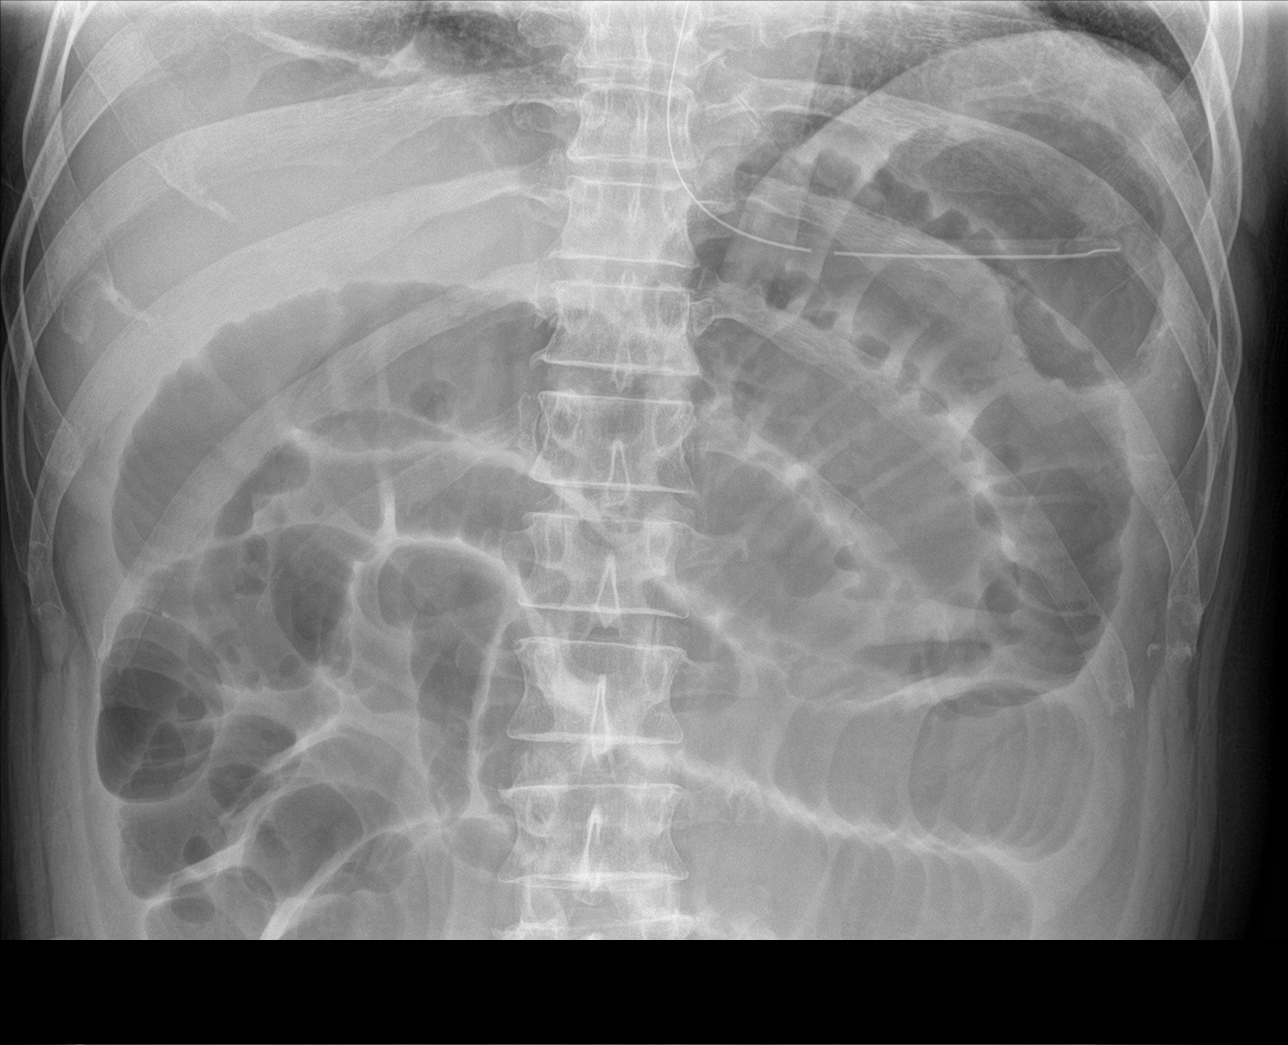
[im 3/3]
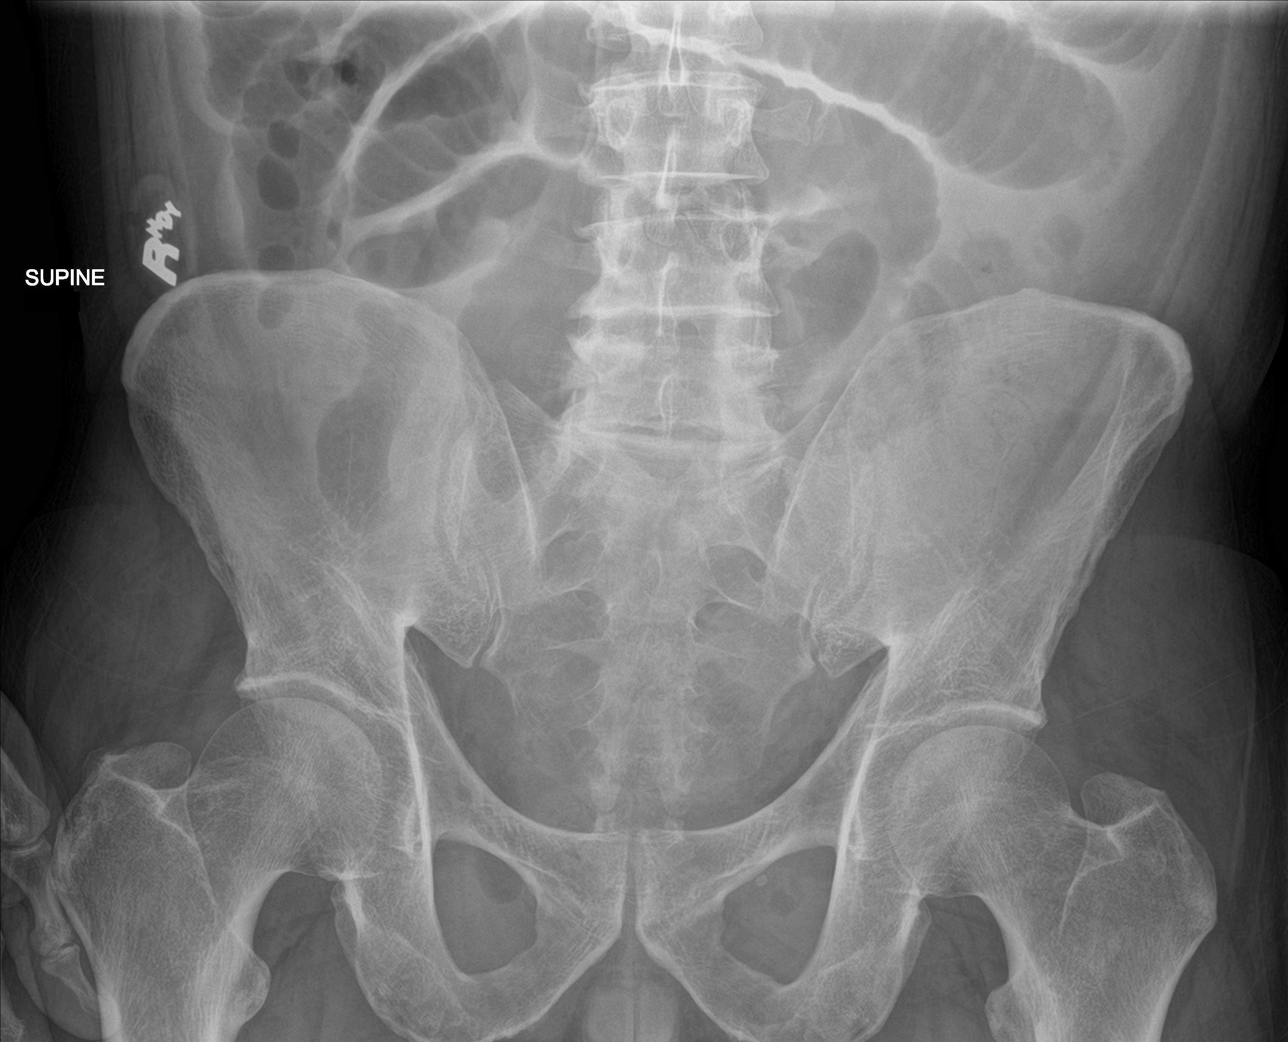

[3 of 3 positions shown; findings below may reference images not displayed]

FINDINGS: Nasogastric tube terminates within the region of the gastric fundus.
Persistently dilated loops of small bowel throughout the abdomen.
Air-fluid level seen on upright view. Minimal bowel gas is evident
within the colon. No free intraperitoneal air is seen. Lung bases
are clear.
IMPRESSION: Bowel gas pattern is suggestive of distal small bowel obstruction
over ileus.

## 2021-05-04 ENCOUNTER — Ambulatory Visit: Payer: 59 | Admitting: Dermatology

## 2021-06-05 ENCOUNTER — Other Ambulatory Visit: Payer: Self-pay

## 2021-06-05 ENCOUNTER — Encounter: Payer: Self-pay | Admitting: Dermatology

## 2021-06-05 ENCOUNTER — Ambulatory Visit: Payer: BC Managed Care – PPO | Admitting: Dermatology

## 2021-06-05 DIAGNOSIS — L821 Other seborrheic keratosis: Secondary | ICD-10-CM | POA: Diagnosis not present

## 2021-06-05 DIAGNOSIS — L814 Other melanin hyperpigmentation: Secondary | ICD-10-CM

## 2021-06-05 DIAGNOSIS — Z1283 Encounter for screening for malignant neoplasm of skin: Secondary | ICD-10-CM | POA: Diagnosis not present

## 2021-06-05 DIAGNOSIS — L820 Inflamed seborrheic keratosis: Secondary | ICD-10-CM

## 2021-06-05 DIAGNOSIS — D485 Neoplasm of uncertain behavior of skin: Secondary | ICD-10-CM

## 2021-06-05 DIAGNOSIS — D1800 Hemangioma unspecified site: Secondary | ICD-10-CM

## 2021-06-05 DIAGNOSIS — L578 Other skin changes due to chronic exposure to nonionizing radiation: Secondary | ICD-10-CM | POA: Diagnosis not present

## 2021-06-05 DIAGNOSIS — Z85828 Personal history of other malignant neoplasm of skin: Secondary | ICD-10-CM

## 2021-06-05 DIAGNOSIS — D229 Melanocytic nevi, unspecified: Secondary | ICD-10-CM

## 2021-06-05 NOTE — Patient Instructions (Signed)

## 2021-06-05 NOTE — Progress Notes (Signed)
? ?Follow-Up Visit ?  ?Subjective  ?Derek Torres is a 56 y.o. male who presents for the following: Annual Exam. The patient presents for Total-Body Skin Exam (TBSE) for skin cancer screening and mole check.  The patient has spots, moles and lesions to be evaluated, some may be new or changing. ? ?The following portions of the chart were reviewed this encounter and updated as appropriate:  ? Tobacco  Allergies  Meds  Problems  Med Hx  Surg Hx  Fam Hx   ?  ?Review of Systems:  No other skin or systemic complaints except as noted in HPI or Assessment and Plan. ? ?Objective  ?Well appearing patient in no apparent distress; mood and affect are within normal limits. ? ?A full examination was performed including scalp, head, eyes, ears, nose, lips, neck, chest, axillae, abdomen, back, buttocks, bilateral upper extremities, bilateral lower extremities, hands, feet, fingers, toes, fingernails, and toenails. All findings within normal limits unless otherwise noted below. ? ?R low back post waistline ?Irregular brown macule 0.6 cm.  ? ? ? ? ?B/L axilla x 16, L ant scalp x1 (17) ?Erythematous stuck-on, waxy papule or plaque ? ? ?Assessment & Plan  ?Neoplasm of uncertain behavior of skin ?R low back post waistline ? ?Epidermal / dermal shaving ? ?Lesion diameter (cm):  0.6 ?Informed consent: discussed and consent obtained   ?Timeout: patient name, date of birth, surgical site, and procedure verified   ?Procedure prep:  Patient was prepped and draped in usual sterile fashion ?Prep type:  Isopropyl alcohol ?Anesthesia: the lesion was anesthetized in a standard fashion   ?Anesthetic:  1% lidocaine w/ epinephrine 1-100,000 buffered w/ 8.4% NaHCO3 ?Instrument used: flexible razor blade   ?Hemostasis achieved with: pressure, aluminum chloride and electrodesiccation   ?Outcome: patient tolerated procedure well   ?Post-procedure details: sterile dressing applied and wound care instructions given   ?Dressing type: bandage and  petrolatum   ? ?Specimen 1 - Surgical pathology ?Differential Diagnosis: D48.5 r/o dysplastic nevus ?Check Margins: No ? ?Inflamed seborrheic keratosis (17) ?B/L axilla x 16, L ant scalp x1 ? ?Destruction of lesion - B/L axilla x 16, L ant scalp x1 ?Complexity: simple   ?Destruction method: cryotherapy   ?Informed consent: discussed and consent obtained   ?Timeout:  patient name, date of birth, surgical site, and procedure verified ?Lesion destroyed using liquid nitrogen: Yes   ?Region frozen until ice ball extended beyond lesion: Yes   ?Outcome: patient tolerated procedure well with no complications   ?Post-procedure details: wound care instructions given   ? ?Skin cancer screening ? ?Lentigines ?- Scattered tan macules ?- Due to sun exposure ?- Benign-appearing, observe ?- Recommend daily broad spectrum sunscreen SPF 30+ to sun-exposed areas, reapply every 2 hours as needed. ?- Call for any changes ? ?Seborrheic Keratoses ?- Stuck-on, waxy, tan-brown papules and/or plaques  ?- Benign-appearing ?- Discussed benign etiology and prognosis. ?- Observe ?- Call for any changes ? ?Melanocytic Nevi ?- Tan-brown and/or pink-flesh-colored symmetric macules and papules ?- Benign appearing on exam today ?- Observation ?- Call clinic for new or changing moles ?- Recommend daily use of broad spectrum spf 30+ sunscreen to sun-exposed areas.  ? ?Hemangiomas ?- Red papules ?- Discussed benign nature ?- Observe ?- Call for any changes ? ?Actinic Damage ?- Chronic condition, secondary to cumulative UV/sun exposure ?- diffuse scaly erythematous macules with underlying dyspigmentation ?- Recommend daily broad spectrum sunscreen SPF 30+ to sun-exposed areas, reapply every 2 hours as needed.  ?- Staying  in the shade or wearing long sleeves, sun glasses (UVA+UVB protection) and wide brim hats (4-inch brim around the entire circumference of the hat) are also recommended for sun protection.  ?- Call for new or changing lesions. ? ?History  of Basal Cell Carcinoma of the Skin ?- No evidence of recurrence today ?- Recommend regular full body skin exams ?- Recommend daily broad spectrum sunscreen SPF 30+ to sun-exposed areas, reapply every 2 hours as needed.  ?- Call if any new or changing lesions are noted between office visits ? ?Skin cancer screening performed today. ? ?Return in about 1 year (around 06/06/2022) for TBSE - hx BCC. ? ?I, Rudell Cobb, CMA, am acting as scribe for Sarina Ser, MD . ?Documentation: I have reviewed the above documentation for accuracy and completeness, and I agree with the above. ? ?Sarina Ser, MD ? ?

## 2021-06-06 ENCOUNTER — Encounter: Payer: Self-pay | Admitting: Dermatology

## 2021-06-08 ENCOUNTER — Telehealth: Payer: Self-pay

## 2021-06-08 NOTE — Telephone Encounter (Signed)
Left message on voicemail to return my call about his pathology results.  ?

## 2021-06-08 NOTE — Telephone Encounter (Signed)
-----   Message from Ralene Bathe, MD sent at 06/06/2021  5:48 PM EDT ----- ?Diagnosis ?Skin , right low back post waistline ?PIGMENTED SEBORRHEIC KERATOSIS ? ?Benign keratosis ?No further treatment needed. ?

## 2021-06-08 NOTE — Telephone Encounter (Signed)
Patient advised of BX results .aw 

## 2021-12-06 ENCOUNTER — Ambulatory Visit: Payer: BC Managed Care – PPO | Admitting: Dermatology

## 2023-01-30 ENCOUNTER — Other Ambulatory Visit: Payer: Self-pay | Admitting: Orthopedic Surgery

## 2023-01-30 DIAGNOSIS — G8929 Other chronic pain: Secondary | ICD-10-CM

## 2023-01-30 DIAGNOSIS — M4807 Spinal stenosis, lumbosacral region: Secondary | ICD-10-CM

## 2023-01-30 DIAGNOSIS — M51362 Other intervertebral disc degeneration, lumbar region with discogenic back pain and lower extremity pain: Secondary | ICD-10-CM

## 2023-02-12 ENCOUNTER — Ambulatory Visit: Payer: BC Managed Care – PPO | Admitting: Dermatology

## 2023-02-25 ENCOUNTER — Encounter: Payer: Self-pay | Admitting: Dermatology

## 2023-02-25 ENCOUNTER — Ambulatory Visit: Payer: BC Managed Care – PPO | Admitting: Dermatology

## 2023-02-25 ENCOUNTER — Ambulatory Visit
Admission: RE | Admit: 2023-02-25 | Discharge: 2023-02-25 | Disposition: A | Discharge status: Home / Self Care | Payer: BC Managed Care – PPO | Source: Ambulatory Visit | Attending: Orthopedic Surgery | Admitting: Orthopedic Surgery

## 2023-02-25 DIAGNOSIS — L720 Epidermal cyst: Secondary | ICD-10-CM | POA: Diagnosis not present

## 2023-02-25 DIAGNOSIS — D170 Benign lipomatous neoplasm of skin and subcutaneous tissue of head, face and neck: Secondary | ICD-10-CM | POA: Diagnosis not present

## 2023-02-25 DIAGNOSIS — L820 Inflamed seborrheic keratosis: Secondary | ICD-10-CM

## 2023-02-25 DIAGNOSIS — L821 Other seborrheic keratosis: Secondary | ICD-10-CM

## 2023-02-25 DIAGNOSIS — L570 Actinic keratosis: Secondary | ICD-10-CM

## 2023-02-25 DIAGNOSIS — M4807 Spinal stenosis, lumbosacral region: Secondary | ICD-10-CM

## 2023-02-25 DIAGNOSIS — M51362 Other intervertebral disc degeneration, lumbar region with discogenic back pain and lower extremity pain: Secondary | ICD-10-CM

## 2023-02-25 DIAGNOSIS — W908XXA Exposure to other nonionizing radiation, initial encounter: Secondary | ICD-10-CM | POA: Diagnosis not present

## 2023-02-25 DIAGNOSIS — G8929 Other chronic pain: Secondary | ICD-10-CM

## 2023-02-25 NOTE — Progress Notes (Signed)
Follow-Up Visit   Subjective  Derek Torres is a 57 y.o. male who presents for the following: lump behind right ear. Dur: couple of years. Lump at right upper forehead. Dur: few years.   Check moles in scalp. Itching at times. Wants to make sure they aren't cancerous or precancerous. Hit with clippers during hair cut.   The patient has spots, moles and lesions to be evaluated, some may be new or changing and the patient may have concern these could be cancer.   The following portions of the chart were reviewed this encounter and updated as appropriate: medications, allergies, medical history  Review of Systems:  No other skin or systemic complaints except as noted in HPI or Assessment and Plan.  Objective  Well appearing patient in no apparent distress; mood and affect are within normal limits.  A focused examination was performed of the following areas: Scalp, face, neck, ears  Relevant exam findings are noted in the Assessment and Plan.      Right occipital x1 Erythematous thin papules/macules with gritty scale.  R Scalp x3 (3) Erythematous keratotic or waxy stuck-on papule or plaque.  Assessment & Plan   EPIDERMAL INCLUSION CYST Exam: Subcutaneous nodule at right post auricular 15 x 12 mm  Benign-appearing. Exam most consistent with an epidermal inclusion cyst. Discussed that a cyst is a benign growth that can grow over time and sometimes get irritated or inflamed. Recommend observation if it is not bothersome. Discussed option of surgical excision to remove it if it is growing, symptomatic, or other changes noted. Please call for new or changing lesions so they can be evaluated.  Lipoma  Exam: right upper forehead. 2 cm soft subcutaneous nodule   Benign-appearing. Exam most consistent with a lipoma. Discussed that a lipoma is a benign fatty growth that can grow over time and sometimes get irritated. Recommend observation if it is not bothersome or changing.  Discussed option of ILK injections or surgical excision to remove it if it is growing, symptomatic, or other changes noted. Please call for new or changing lesions so they can be evaluated.   Will ask Dr. Gwen Pounds if he removes lipomas on forehead/temples. If not will send referral to plastic surgeon. Will call patient with update.  SEBORRHEIC KERATOSIS - Stuck-on, waxy, tan-brown papules and/or plaques at scalp - Benign-appearing - Discussed benign etiology and prognosis. - Observe - Call for any changes    AK (ACTINIC KERATOSIS) Right occipital x1 Actinic keratoses are precancerous spots that appear secondary to cumulative UV radiation exposure/sun exposure over time. They are chronic with expected duration over 1 year. A portion of actinic keratoses will progress to squamous cell carcinoma of the skin. It is not possible to reliably predict which spots will progress to skin cancer and so treatment is recommended to prevent development of skin cancer.  Recommend daily broad spectrum sunscreen SPF 30+ to sun-exposed areas, reapply every 2 hours as needed.  Recommend staying in the shade or wearing long sleeves, sun glasses (UVA+UVB protection) and wide brim hats (4-inch brim around the entire circumference of the hat). Call for new or changing lesions. Destruction of lesion - Right occipital x1 Complexity: simple   Destruction method: cryotherapy   Informed consent: discussed and consent obtained   Timeout:  patient name, date of birth, surgical site, and procedure verified Lesion destroyed using liquid nitrogen: Yes   Region frozen until ice ball extended beyond lesion: Yes   Cryo cycles: 1 or 2. Outcome: patient  tolerated procedure well with no complications   Post-procedure details: wound care instructions given   Additional details:  Prior to procedure, discussed risks of blister formation, small wound, skin dyspigmentation, or rare scar following cryotherapy. Recommend Vaseline  ointment to treated areas while healing.  INFLAMED SEBORRHEIC KERATOSIS (3) R Scalp x3 (3) Symptomatic, irritating, patient would like treated. Destruction of lesion - R Scalp x3 (3) Complexity: simple   Destruction method: cryotherapy   Informed consent: discussed and consent obtained   Timeout:  patient name, date of birth, surgical site, and procedure verified Lesion destroyed using liquid nitrogen: Yes   Region frozen until ice ball extended beyond lesion: Yes   Cryo cycles: 1 or 2. Outcome: patient tolerated procedure well with no complications   Post-procedure details: wound care instructions given   Additional details:  Prior to procedure, discussed risks of blister formation, small wound, skin dyspigmentation, or rare scar following cryotherapy. Recommend Vaseline ointment to treated areas while healing.  EIC (EPIDERMAL INCLUSION CYST)   LIPOMA OF FACE    Return for Cyst Excision, Next Available.  I, Lawson Radar, CMA, am acting as scribe for Elie Goody, MD.   Documentation: I have reviewed the above documentation for accuracy and completeness, and I agree with the above.  Elie Goody, MD

## 2023-02-25 NOTE — Patient Instructions (Addendum)
Pre-Operative Instructions  You are scheduled for a surgical procedure at [facility]. We recommend you read the following instructions. If you have any questions or concerns, please call the office at [phone].  Shower and wash the entire body with soap and water the day of your surgery paying special attention to cleansing at and around the planned surgery site.  Avoid aspirin or aspirin containing products at least fourteen (14) days prior to your surgical procedure and for at least one week (7 Days) after your surgical procedure. If you take aspirin on a regular basis for heart disease or history of stroke or for any other reason, we may recommend you continue taking aspirin but please notify us if you take this on a regular basis. Aspirin can cause more bleeding to occur during surgery as well as prolonged bleeding and bruising after surgery.   Avoid other nonsteroidal pain medications at least one week prior to surgery and at least one week prior to your surgery. These include medications such as Ibuprofen (Motrin, Advil and Nuprin), Naprosyn, Voltaren, Relafen, etc. If medications are used for therapeutic reasons, please inform us as they can cause increased bleeding or prolonged bleeding during and bruising after surgical procedures.   Please advise [place] if you are taking any "blood thinner" medications such as Coumadin or Dipyridamole or Plavix or similar medications. These cause increased bleeding and prolonged bleeding during procedures and bruising after surgical procedures. We may have to consider discontinuing these medications briefly prior to and shortly after your surgery if safe to do so.   Please inform us of all medications you are currently taking. All medications that are taken regularly should be taken the day of surgery as you always do. Nevertheless, we need to be informed of what medications you are taking prior to surgery to know whether they will affect the  procedure or cause any complications.   Please inform us of any medication allergies. Also inform us of whether you have allergies to Latex or rubber products or whether you have had any adverse reaction to Lidocaine or Epinephrine.  Please inform us of any prosthetic or artificial body parts such as artificial heart valve, joint replacements, etc., or similar condition that might require preoperative antibiotics.   We recommend avoidance of alcohol at least two weeks prior to surgery and continued avoidance for at least two weeks after surgery.   We recommend discontinuation of tobacco smoking at least two weeks prior to surgery and continued abstinence for at least two weeks after surgery.  Do not plan strenuous exercise, strenuous work or strenuous lifting for approximately four weeks after your surgery.   We request if you are unable to make your scheduled surgical appointment, please call us at least a week in advance or as soon as you are aware of a problem so that we can cancel or reschedule the appointment.   You MAY TAKE TYLENOL (acetaminophen) for pain as it is not a blood thinner.   PLEASE PLAN TO BE IN TOWN FOR TWO WEEKS FOLLOWING SURGERY, THIS IS IMPORTANT SO YOU CAN BE CHECKED FOR DRESSING CHANGES, SUTURE REMOVAL AND TO MONITOR FOR POSSIBLE COMPLICATIONS.    Cryotherapy Aftercare  Wash gently with soap and water everyday.   Apply Vaseline  daily until healed.     Recommend daily broad spectrum sunscreen SPF 30+ to sun-exposed areas, reapply every 2 hours as needed. Call for new or changing lesions.  Staying in the shade or wearing long sleeves,  sun glasses (UVA+UVB protection) and wide brim hats (4-inch brim around the entire circumference of the hat) are also recommended for sun protection.      Due to recent changes in healthcare laws, you may see results of your pathology and/or laboratory studies on MyChart before the doctors have had a chance to review them. We  understand that in some cases there may be results that are confusing or concerning to you. Please understand that not all results are received at the same time and often the doctors may need to interpret multiple results in order to provide you with the best plan of care or course of treatment. Therefore, we ask that you please give [place] 2 business days to thoroughly review all your results before contacting the office for clarification. Should we see a critical lab result, you will be contacted sooner.   If You Need Anything After Your Visit  If you have any questions or concerns for your doctor, please call our main line at [phone] and press option 4 to reach your doctor's medical assistant. If no one answers, please leave a voicemail as directed and we will return your call as soon as possible. Messages left after 4 pm will be answered the following business day.   You may also send [place] a message via MyChart. We typically respond to MyChart messages within 1-2 business days.  For prescription refills, please ask your pharmacy to contact our office. Our fax number is [phone].  If you have an urgent issue when the clinic is closed that cannot wait until the next business day, you can page your doctor at the number below.    Please note that while we do our best to be available for urgent issues outside of office hours, we are not available 24/7.   If you have an urgent issue and are unable to reach [place], you may choose to seek medical care at your doctor's office, retail clinic, urgent care center, or emergency room.  If you have a medical emergency, please immediately call 911 or go to the emergency department.  Pager Numbers  - Dr. Gwen Pounds: [phone]  - Dr. Roseanne Reno: [phone]  - Dr. Katrinka Blazing: [phone]   In the event of inclement weather, please call our main line at [phone] for an update on the status of any delays or closures.  Dermatology Medication Tips: Please  keep the boxes that topical medications come in in order to help keep track of the instructions about where and how to use these. Pharmacies typically print the medication instructions only on the boxes and not directly on the medication tubes.   If your medication is too expensive, please contact our office at [phone] option 4 or send [place] a message through MyChart.   We are unable to tell what your co-pay for medications will be in advance as this is different depending on your insurance coverage. However, we may be able to find a substitute medication at lower cost or fill out paperwork to get insurance to cover a needed medication.   If a prior authorization is required to get your medication covered by your insurance company, please allow [place] 1-2 business days to complete this process.  Drug prices often vary depending on where the prescription is filled and some pharmacies may offer cheaper prices.  The website www.goodrx.com contains coupons for medications through different pharmacies. The prices here do not account for what the cost may be with help from insurance (it may be cheaper  with your insurance), but the website can give you the price if you did not use any insurance.  - You can print the associated coupon and take it with your prescription to the pharmacy.  - You may also stop by our office during regular business hours and pick up a GoodRx coupon card.  - If you need your prescription sent electronically to a different pharmacy, notify our office through [facility] or by phone at [phone] option 4.     Si Usted Necesita Algo Despus de Su Visita  Tambin puede enviarnos un mensaje a travs de Clinical cytogeneticist. Por lo general respondemos a los mensajes de MyChart en el transcurso de 1 a 2 das hbiles.  Para renovar recetas, por favor pida a su farmacia que se ponga en contacto con nuestra oficina. Annie Sable de fax es [place] [phone].  Si tiene un asunto urgente  cuando la clnica est cerrada y que no puede esperar hasta el siguiente da hbil, puede llamar/localizar a su doctor(a) al nmero que aparece a continuacin.   Por favor, tenga en cuenta que aunque hacemos todo lo posible para estar disponibles para asuntos urgentes fuera del horario de [place], no estamos disponibles las 24 horas del da, los 7 [address]  [address] de la [place].   Si tiene un problema urgente y no puede comunicarse con nosotros, puede optar por buscar atencin mdica  en el consultorio de su doctor(a), en una clnica privada, en un centro de atencin urgente o en una sala de emergencias.  Si tiene Engineer, drilling, por favor llame inmediatamente al 911 o vaya a la sala de emergencias.  Nmeros de bper  - Dr. Gwen Pounds: [phone]  - Dra. Roseanne Reno: [phone]  - Dr. Katrinka Blazing: [phone]   En caso de inclemencias del tiempo, por favor llame a Lacy Duverney principal al [phone] para una actualizacin sobre el [place] de cualquier retraso o cierre.  Consejos para la medicacin en dermatologa: Por favor, guarde las cajas en las que vienen los medicamentos de uso tpico para ayudarle a seguir las instrucciones sobre dnde y cmo usarlos. Las farmacias generalmente imprimen las instrucciones del medicamento slo en las cajas y no directamente en los tubos del [place].   Si su medicamento es muy caro, por favor, pngase en contacto con Rolm Gala llamando al [phone] y presione la opcin 4 o envenos un mensaje a travs de Clinical cytogeneticist.   No podemos decirle cul ser su copago por los medicamentos por adelantado ya que esto es diferente dependiendo de la cobertura de su seguro. Sin embargo, es posible que podamos encontrar un medicamento sustituto a Audiological scientist un formulario para que el seguro cubra el medicamento que se considera necesario.   Si se requiere una autorizacin previa para que su compaa de seguros [place] su medicamento, por favor permtanos de 1 a 2  das hbiles para completar [address]  Los precios de los medicamentos varan con frecuencia dependiendo del Environmental consultant de dnde se surte la receta y alguna farmacias pueden ofrecer precios ms baratos.  El sitio web www.goodrx.com tiene cupones para medicamentos de Health and safety inspector. Los precios aqu no tienen en cuenta lo que podra costar con la ayuda del seguro (puede ser ms barato con su seguro), pero el sitio web puede darle el precio si no utiliz Tourist information centre manager.  - Puede imprimir el cupn correspondiente y llevarlo con su receta a la farmacia.  - Tambin puede pasar por nuestra oficina durante el horario de atencin regular y Education officer, museum  una tarjeta de cupones de GoodRx.  - Si necesita que su receta se enve electrnicamente a una farmacia diferente, informe a nuestra oficina a travs de MyChart de [place] o por telfono llamando al [phone] y presione la opcin 4.

## 2023-03-02 ENCOUNTER — Encounter: Payer: Self-pay | Admitting: Dermatology

## 2023-03-11 ENCOUNTER — Other Ambulatory Visit: Payer: Self-pay

## 2023-03-11 DIAGNOSIS — L720 Epidermal cyst: Secondary | ICD-10-CM

## 2023-03-11 DIAGNOSIS — D170 Benign lipomatous neoplasm of skin and subcutaneous tissue of head, face and neck: Secondary | ICD-10-CM

## 2023-03-11 NOTE — Telephone Encounter (Signed)
Referral sent, JS

## 2023-04-17 ENCOUNTER — Encounter: Payer: BC Managed Care – PPO | Admitting: Dermatology

## 2023-06-04 ENCOUNTER — Institutional Professional Consult (permissible substitution): Payer: BC Managed Care – PPO | Admitting: Plastic Surgery

## 2023-06-06 ENCOUNTER — Other Ambulatory Visit: Payer: Self-pay | Admitting: Gastroenterology

## 2023-06-06 DIAGNOSIS — Z800 Family history of malignant neoplasm of digestive organs: Secondary | ICD-10-CM

## 2023-06-06 DIAGNOSIS — R1010 Upper abdominal pain, unspecified: Secondary | ICD-10-CM

## 2023-06-20 ENCOUNTER — Ambulatory Visit: Admission: RE | Admit: 2023-06-20 | Source: Ambulatory Visit

## 2023-06-21 ENCOUNTER — Ambulatory Visit
Admission: RE | Admit: 2023-06-21 | Discharge: 2023-06-21 | Disposition: A | Discharge status: Home / Self Care | Source: Ambulatory Visit | Attending: Gastroenterology | Admitting: Gastroenterology

## 2023-06-21 DIAGNOSIS — Z800 Family history of malignant neoplasm of digestive organs: Secondary | ICD-10-CM | POA: Insufficient documentation

## 2023-06-21 DIAGNOSIS — R1010 Upper abdominal pain, unspecified: Secondary | ICD-10-CM | POA: Insufficient documentation

## 2023-06-21 LAB — POCT I-STAT CREATININE: Creatinine, Ser: 0.9 mg/dL (ref 0.61–1.24)

## 2023-06-21 MED ORDER — IOHEXOL 350 MG/ML SOLN
100.0000 mL | Freq: Once | INTRAVENOUS | Status: AC | PRN
  Administered 2023-06-21: 100 mL via INTRAVENOUS

## 2023-08-06 ENCOUNTER — Inpatient Hospital Stay

## 2023-08-06 ENCOUNTER — Inpatient Hospital Stay: Attending: Licensed Clinical Social Worker | Admitting: Licensed Clinical Social Worker

## 2024-04-30 ENCOUNTER — Ambulatory Visit: Admitting: Dermatology

## 2024-05-11 ENCOUNTER — Ambulatory Visit
